# Patient Record
Sex: Female | Born: 1960 | Race: White | Hispanic: No | Smoking: Never smoker
Health system: Southern US, Community
[De-identification: ages and names within clinical notes are randomized; demographics above are authoritative.]

## PROBLEM LIST (undated history)

## (undated) DIAGNOSIS — F419 Anxiety disorder, unspecified: Secondary | ICD-10-CM

## (undated) DIAGNOSIS — N189 Chronic kidney disease, unspecified: Secondary | ICD-10-CM

## (undated) DIAGNOSIS — R0602 Shortness of breath: Secondary | ICD-10-CM

## (undated) DIAGNOSIS — F329 Major depressive disorder, single episode, unspecified: Secondary | ICD-10-CM

## (undated) DIAGNOSIS — G4730 Sleep apnea, unspecified: Secondary | ICD-10-CM

## (undated) DIAGNOSIS — J45909 Unspecified asthma, uncomplicated: Secondary | ICD-10-CM

## (undated) DIAGNOSIS — E538 Deficiency of other specified B group vitamins: Secondary | ICD-10-CM

## (undated) DIAGNOSIS — R002 Palpitations: Secondary | ICD-10-CM

## (undated) DIAGNOSIS — G43909 Migraine, unspecified, not intractable, without status migrainosus: Secondary | ICD-10-CM

## (undated) DIAGNOSIS — K219 Gastro-esophageal reflux disease without esophagitis: Secondary | ICD-10-CM

## (undated) DIAGNOSIS — M797 Fibromyalgia: Secondary | ICD-10-CM

## (undated) DIAGNOSIS — Z8489 Family history of other specified conditions: Secondary | ICD-10-CM

## (undated) DIAGNOSIS — E785 Hyperlipidemia, unspecified: Secondary | ICD-10-CM

## (undated) DIAGNOSIS — D126 Benign neoplasm of colon, unspecified: Secondary | ICD-10-CM

## (undated) DIAGNOSIS — F32A Depression, unspecified: Secondary | ICD-10-CM

## (undated) DIAGNOSIS — K589 Irritable bowel syndrome without diarrhea: Secondary | ICD-10-CM

## (undated) DIAGNOSIS — G4700 Insomnia, unspecified: Secondary | ICD-10-CM

## (undated) HISTORY — DX: Migraine, unspecified, not intractable, without status migrainosus: G43.909

## (undated) HISTORY — PX: KNEE SURGERY: SHX244

## (undated) HISTORY — DX: Depression, unspecified: F32.A

## (undated) HISTORY — PX: ELBOW SURGERY: SHX618

## (undated) HISTORY — DX: Fibromyalgia: M79.7

## (undated) HISTORY — PX: VAGINAL HYSTERECTOMY: SUR661

## (undated) HISTORY — DX: Benign neoplasm of colon, unspecified: D12.6

## (undated) HISTORY — DX: Chronic kidney disease, unspecified: N18.9

## (undated) HISTORY — DX: Deficiency of other specified B group vitamins: E53.8

## (undated) HISTORY — DX: Insomnia, unspecified: G47.00

## (undated) HISTORY — PX: HERNIA REPAIR: SHX51

## (undated) HISTORY — PX: ECTOPIC PREGNANCY SURGERY: SHX613

## (undated) HISTORY — DX: Anxiety disorder, unspecified: F41.9

## (undated) HISTORY — DX: Hyperlipidemia, unspecified: E78.5

## (undated) HISTORY — PX: POLYPECTOMY: SHX149

## (undated) HISTORY — DX: Major depressive disorder, single episode, unspecified: F32.9

---

## 1987-08-29 HISTORY — PX: CHOLECYSTECTOMY: SHX55

## 2011-06-02 ENCOUNTER — Other Ambulatory Visit: Payer: Self-pay | Admitting: Family Medicine

## 2011-10-03 ENCOUNTER — Other Ambulatory Visit: Payer: Self-pay | Admitting: Family Medicine

## 2011-10-03 DIAGNOSIS — Z1231 Encounter for screening mammogram for malignant neoplasm of breast: Secondary | ICD-10-CM

## 2011-10-17 ENCOUNTER — Ambulatory Visit: Payer: Self-pay

## 2011-10-24 ENCOUNTER — Ambulatory Visit: Payer: Self-pay

## 2011-11-15 ENCOUNTER — Ambulatory Visit: Payer: Self-pay

## 2011-11-28 ENCOUNTER — Ambulatory Visit
Admission: RE | Admit: 2011-11-28 | Discharge: 2011-11-28 | Disposition: A | Discharge status: Home / Self Care | Payer: Medicare Other | Source: Ambulatory Visit | Attending: Family Medicine | Admitting: Family Medicine

## 2011-11-28 DIAGNOSIS — Z1231 Encounter for screening mammogram for malignant neoplasm of breast: Secondary | ICD-10-CM

## 2013-01-17 ENCOUNTER — Ambulatory Visit: Payer: Medicare Other | Admitting: Diagnostic Neuroimaging

## 2013-01-29 ENCOUNTER — Ambulatory Visit (INDEPENDENT_AMBULATORY_CARE_PROVIDER_SITE_OTHER): Payer: Medicare Other | Admitting: Diagnostic Neuroimaging

## 2013-01-29 ENCOUNTER — Encounter: Payer: Self-pay | Admitting: Diagnostic Neuroimaging

## 2013-01-29 VITALS — BP 136/82 | HR 68 | Temp 98.9°F | Ht 65.5 in | Wt 214.0 lb

## 2013-01-29 DIAGNOSIS — R5383 Other fatigue: Secondary | ICD-10-CM

## 2013-01-29 DIAGNOSIS — R200 Anesthesia of skin: Secondary | ICD-10-CM | POA: Insufficient documentation

## 2013-01-29 DIAGNOSIS — R259 Unspecified abnormal involuntary movements: Secondary | ICD-10-CM

## 2013-01-29 DIAGNOSIS — R531 Weakness: Secondary | ICD-10-CM | POA: Insufficient documentation

## 2013-01-29 DIAGNOSIS — R251 Tremor, unspecified: Secondary | ICD-10-CM

## 2013-01-29 DIAGNOSIS — R5381 Other malaise: Secondary | ICD-10-CM

## 2013-01-29 DIAGNOSIS — R209 Unspecified disturbances of skin sensation: Secondary | ICD-10-CM

## 2013-01-29 NOTE — Progress Notes (Signed)
[facility]  PATIENT: [name] DOB: 1961/07/30  REFERRING CLINICIAN: Stallings HISTORY FROM: patient  REASON FOR VISIT: new consult   HISTORICAL  CHIEF COMPLAINT:  Chief Complaint  Patient presents with  . Neurologic Problem    HISTORY OF PRESENT ILLNESS:   52 year old right-handed female here for evaluation of headaches, vision changes, muscle cramps, pain.  Patient reports long history of migraine headaches, 20 days of headache per month. These are bilateral severe headaches with nausea, vomiting, photophobia and phonophobia. Headaches can last up to one to 2 days at a time. She thinks she may have tried amitriptyline and Topamax the past.  Also patient is concerned about possibility of multiple sclerosis. Patient's sister diagnosed with multiple scars 2004 and the patient feels she is having similar symptoms. She describes intermittent cramps of her right hand lasting for a few seconds a time. She has intermittent left facial pain. She has had intermittent postural tremor for the past 3-4 months. Tremor can also occur at rest. She has significant gait deterioration over the past one to 2 years. She is having some urinary and bowel control problems. Also reports numerous things and review of systems.  REVIEW OF SYSTEMS: Full 14 system review of systems performed and notable only for fever chills fatigue palpitation ringing in ears trouble swallowing rash birthmarks itching diarrhea constipation incontinence shortness of breath cough wheezing snoring or vision loss of vision easy bruising easy bleeding feeling hot feeling cold increased thirst flushing joint pain joint swelling cramps aching muscles allergies runny nose racing thoughts prior suicidal thoughts disinterest in activities decreased at to be decreased energy not sleep depression anxiety dizziness difficulty swallowing restless leg snoring sleepiness insomnia numbness weakness headache confusion  memory loss.  ALLERGIES: Allergies  Allergen Reactions  . Aspirin Swelling  . Benzocaine   . Lyrica (Pregabalin)     HOME MEDICATIONS: No outpatient prescriptions prior to visit.   No facility-administered medications prior to visit.    PAST MEDICAL HISTORY: Past Medical History  Diagnosis Date  . Migraine   . Anxiety and depression   . Fibromyalgia     PAST SURGICAL HISTORY: Past Surgical History  Procedure Laterality Date  . Ectopic pregnancy surgery    . Elbow surgery    . Vaginal hysterectomy    . Hernia repair    . Cholecystectomy    . Knee surgery      FAMILY HISTORY: Family History  Problem Relation Age of Onset  . Multiple sclerosis Sister   . Depression    . Diabetes    . Heart attack      SOCIAL HISTORY:  History   Social History  . Marital Status: Divorced    Spouse Name: N/A    Number of Children: 1  . Years of Education: 12th   Occupational History  . N/A    Social History Main Topics  . Smoking status: Never Smoker   . Smokeless tobacco: Never Used  . Alcohol Use: Not on file     Comment: occasionally  . Drug Use: No  . Sexually Active: Not on file   Other Topics Concern  . Not on file   Social History Narrative   Pt lives at home alone.   Caffeine Use: none; quit years ago     PHYSICAL EXAM  Filed Vitals:   01/29/13 1229  BP: 136/82  Pulse: 68  Temp: 98.9 F (37.2 C)  TempSrc: Oral  Height: 5' 5.5" (1.664 m)  Weight: 214  lb (97.07 kg)    Not recorded    Body mass index is 35.06 kg/(m^2).  GENERAL EXAM: Patient is in no distress  CARDIOVASCULAR: Regular rate and rhythm, no murmurs, no carotid bruits  NEUROLOGIC: MENTAL STATUS: awake, alert, language fluent, comprehension intact, naming intact CRANIAL NERVE: no papilledema on fundoscopic exam, RELATIVE RIGHT APD. DECR SENS IN RIGHT EYE TO LIGHT. visual fields full to confrontation, extraocular muscles intact, no nystagmus, facial sensation and strength  symmetric, uvula midline, shoulder shrug symmetric, tongue midline. MOTOR: POSTURAL AND RESTING TREMOR. INCREASE TONE IN BUE AND BLE (RIGHT > LEFT). CLONUS IN RIGHT ANKLE. DECR STRENGTH IN BLE (4/5).  SENSORY: DECR SENS IN TOES.  COORDINATION: ATAXIA IN BUE. REFLEXES: RUE 4, LUE 3, RIGHT KNEE 1, LEFT KNEE 2, RIGHT ANKLE 4, LEFT ANKLE 2. MUTE TOES. GAIT/STATION: UNSTEADY, ATAXIC GAIT.   DIAGNOSTIC DATA (LABS, IMAGING, TESTING) - I reviewed patient records, labs, notes, testing and imaging myself where available.  No results found for this basename: WBC, HGB, HCT, MCV, PLT   No results found for this basename: na, k, cl, co2, glucose, bun, creatinine, calcium, prot, albumin, ast, alt, alkphos, bilitot, gfrnonaa, gfraa   No results found for this basename: CHOL, HDL, LDLCALC, LDLDIRECT, TRIG, CHOLHDL   No results found for this basename: HGBA1C   No results found for this basename: VITAMINB12   No results found for this basename: TSH      ASSESSMENT AND PLAN  52 y.o. year old female  has a past medical history of Migraine; Anxiety and depression; and Fibromyalgia. here with migraine headaches. We can treat these with sumatriptan. More concerning are the constellation of other symptoms and physical exam findings raising possibility of demyelinating disease. I will check MRI of the brain and cervical spine for further evaluation.   Orders Placed This Encounter  Procedures  . MR Brain W Wo Contrast  . MR Cervical Spine W Wo Contrast     Suanne Marker, MD 01/29/2013, 1:16 PM Certified in Neurology, Neurophysiology and Neuroimaging  [facility] [address], [address] [place], [place] [address] [phone]

## 2013-01-29 NOTE — Patient Instructions (Signed)
I will order MRIs.

## 2013-01-30 ENCOUNTER — Encounter: Payer: Self-pay | Admitting: Diagnostic Neuroimaging

## 2013-02-09 ENCOUNTER — Ambulatory Visit
Admission: RE | Admit: 2013-02-09 | Discharge: 2013-02-09 | Disposition: A | Discharge status: Home / Self Care | Payer: Medicare Other | Source: Ambulatory Visit | Attending: Diagnostic Neuroimaging | Admitting: Diagnostic Neuroimaging

## 2013-02-09 DIAGNOSIS — R251 Tremor, unspecified: Secondary | ICD-10-CM

## 2013-02-09 DIAGNOSIS — R209 Unspecified disturbances of skin sensation: Secondary | ICD-10-CM

## 2013-02-09 DIAGNOSIS — R5381 Other malaise: Secondary | ICD-10-CM

## 2013-02-09 DIAGNOSIS — R531 Weakness: Secondary | ICD-10-CM

## 2013-02-09 DIAGNOSIS — R5383 Other fatigue: Secondary | ICD-10-CM

## 2013-02-09 DIAGNOSIS — R200 Anesthesia of skin: Secondary | ICD-10-CM

## 2013-02-09 DIAGNOSIS — R259 Unspecified abnormal involuntary movements: Secondary | ICD-10-CM

## 2013-02-13 ENCOUNTER — Telehealth: Payer: Self-pay | Admitting: *Deleted

## 2013-02-13 NOTE — Telephone Encounter (Signed)
Message copied by Avie Echevaria on Thu Feb 13, 2013 12:47 PM ------      Message from: Joycelyn Schmid      Created: Thu Feb 13, 2013 10:07 AM       Call pt with normal MRI brain and cervical spine results. -VRP            ----- Message -----         From: Suanne Marker, MD         Sent: 02/11/2013   6:06 PM           To: Suanne Marker, MD                   ------

## 2013-02-13 NOTE — Telephone Encounter (Signed)
Spoke with patient and relayed the results of their MRI's.  The patient was also reminded of any future appointments.  The pt expressed concern because her symptoms have worsened and she is not scheduled for a f/u until October 06, 2013.  She would like a plan of care / advice she should follow until her next encounter.  Please advise.

## 2013-02-13 NOTE — Telephone Encounter (Signed)
Voice message left requesting a call-back. 

## 2013-02-14 ENCOUNTER — Encounter (HOSPITAL_BASED_OUTPATIENT_CLINIC_OR_DEPARTMENT_OTHER): Payer: Self-pay | Admitting: *Deleted

## 2013-02-14 NOTE — Progress Notes (Signed)
Pt instructed npo p mn 6/25 x prevacid, vistaril w sip of water.  To [facility] 6/26 @ 1030.  Needs hgb, ?ekg. Pt asked to use her inhaler the am of surgery and bring it w/ her as well as her cpap mask. Pt  Verbalized her understanding

## 2013-02-19 NOTE — H&P (Signed)
[name] DOB: 12/28/1960  Chief Complaint: right knee pain  History of Present Illness The patient is a 52 year old female who presents with knee complaints. The patient reports right knee symptoms including: pain which began 2 months ago following a specific injury. The injury occurred due to a fall while the patient was at home. The patient describes the severity of the symptoms as moderate in severity.The patient feels that the symptoms are worsening. Symptoms are reported to be located in the right knee and include knee pain, swelling, decreased range of motion, instability, difficulty bearing weight and difficulty ambulating. The patient describes their pain as sharp and aching. Symptoms are exacerbated by motion at the knee, weight bearing and walking. She has had previous arthroscopy on the right knee in the past. She had a cortisone injection in the right knee, which provided limited benefit. MRI revealed a tear of the medial meniscus, tear of the lateral meniscus, and she has a complete to near complete chondral loss of the posterior weightbearing surface of the lateral femoral condyle. She has some lateral tibial plateau changes as well.   Allergies Aspirin *ANALGESICS - NonNarcotic* Benzocaine *DERMATOLOGICALS* Lyrica *ANTICONVULSANTS*   Family History Bleeding disorder. mother Cancer. grandmother fathers side Cerebrovascular Accident. mother Congestive Heart Failure. mother Depression. father Diabetes Mellitus. mother Drug / Alcohol Addiction. sister and grandfather mothers side Heart Disease. mother Heart disease in female family member before age 50 Heart disease in female family member before age 32 Hypertension. mother and brother Kidney disease. mother   Social History Alcohol use. current drinker; drinks wine and hard liquor; only occasionally per week Children. 1 Current work status. disabled Living situation. live alone Marital  status. divorced Tobacco use. never smoker   Medication History HydrOXYzine Pamoate (25MG  Capsule, Oral) Active. Venlafaxine HCl ER (75MG  Capsule ER 24HR, Oral) Active. TraZODone HCl (50MG  Tablet, Oral) Active. Lansoprazole (30MG  Capsule DR, Oral) Active. SUMAtriptan Succinate (25MG  Tablet, Oral) Active. Atrovent HFA (17MCG/ACT Aerosol Soln, Inhalation) Active. Vitamin D (50000U Tablet, Oral) Active.    Past Surgical History Gallbladder Surgery. open Hemorrhoidectomy Hysterectomy. complete (non-cancerous)   Past Medical History Anxiety Disorder Asthma Chronic Pain Depression Fibromyalgia Gastroesophageal Reflux Disease Migraine Headache Sleep Apnea   Review of Systems General:Present- Chills, Fever, Night Sweats and Fatigue. Not Present- Appetite Loss, Feeling sick, Weight Gain and Weight Loss. Skin:Present- Itching and Change in Hair or Nails. Not Present- Rash, Skin Color Changes, Ulcer and Psoriasis. HEENT:Present- Sensitivity to light and Visual Loss. Not Present- Hearing problems, Nose Bleed, Decreased Hearing and Ringing in the Ears. Neck:Present- Swollen Glands. Not Present- Neck Mass. Respiratory:Present- Shortness of breath, Snoring, Chronic Cough and Dyspnea. Not Present- Bloody sputum. Cardiovascular:Present- Shortness of Breath, Leg Cramps and Palpitations. Not Present- Chest Pain and Swelling of Extremities. Gastrointestinal:Present- Heartburn, Abdominal Pain, Vomiting, Nausea and Incontinence of Stool. Not Present- Bloody Stool. Female Genitourinary:Not Present- Blood in Urine, Irregular/missing periods, Menstrual Irregularities, Frequency, Incontinence and Nocturia. Musculoskeletal:Present- Muscle Weakness, Muscle Pain, Joint Stiffness, Joint Swelling, Joint Pain and Back Pain. Neurological:Present- Tingling, Numbness, Burning, Tremor, Headaches and Dizziness. Psychiatric:Present- Anxiety, Depression and Memory Loss. Endocrine:Present-  Cold Intolerance, Heat Intolerance and Excessive Thirst. Not Present- Excessive hunger. Hematology:Present- Abnormal bruising and Easy Bruising. Not Present- Abnormal Bleeding, Anemia and Blood Clots.   Vitals Weight: 216 lb Height: 65.5 in Body Surface Area: 2.13 m Body Mass Index: 35.4 kg/m BP: 112/72 (Sitting, Left Arm, Standard)    Objective  Physical examination of her right knee reveals no instability. Positive McMurrays. Mild  effusion. No erythema. No signs of gout. No signs of infection.  Flexion and extension is very painful. She is stable as far as her collateral ligaments and cruciate ligaments. Her calf is fine, no deep venous thrombosis. Heart sounds normal. RRR. Lungs clear to auscultation. Bowel sounds active. EOM intact. Neck supple.   Assessment & Plan Right knee, medial and lateral meniscus tears She definitely needs an arthroscopic medial and lateral meniscectomy and clean out of the right knee under general. Risks of surgery are rare. Infection is rare, put her on antibiotics. She cannot take aspirin, so we will just probably put her on Lovenox shots post op. She should not eat or drink after midnight the night of surgery.     Dimitri Ped, PA-C

## 2013-02-20 ENCOUNTER — Other Ambulatory Visit: Payer: Self-pay

## 2013-02-20 ENCOUNTER — Encounter (HOSPITAL_BASED_OUTPATIENT_CLINIC_OR_DEPARTMENT_OTHER): Payer: Self-pay | Admitting: Anesthesiology

## 2013-02-20 ENCOUNTER — Encounter (HOSPITAL_BASED_OUTPATIENT_CLINIC_OR_DEPARTMENT_OTHER): Payer: Self-pay | Admitting: *Deleted

## 2013-02-20 ENCOUNTER — Encounter (HOSPITAL_BASED_OUTPATIENT_CLINIC_OR_DEPARTMENT_OTHER)
Admission: RE | Disposition: A | Discharge status: Home / Self Care | Payer: Self-pay | Source: Ambulatory Visit | Attending: Orthopedic Surgery

## 2013-02-20 ENCOUNTER — Ambulatory Visit (HOSPITAL_BASED_OUTPATIENT_CLINIC_OR_DEPARTMENT_OTHER)
Admission: RE | Admit: 2013-02-20 | Discharge: 2013-02-20 | Disposition: A | Discharge status: Home / Self Care | Payer: Medicare Other | Source: Ambulatory Visit | Attending: Orthopedic Surgery | Admitting: Orthopedic Surgery

## 2013-02-20 ENCOUNTER — Ambulatory Visit (HOSPITAL_BASED_OUTPATIENT_CLINIC_OR_DEPARTMENT_OTHER): Payer: Medicare Other | Admitting: Anesthesiology

## 2013-02-20 DIAGNOSIS — M659 Unspecified synovitis and tenosynovitis, unspecified site: Secondary | ICD-10-CM | POA: Insufficient documentation

## 2013-02-20 DIAGNOSIS — M23269 Derangement of other lateral meniscus due to old tear or injury, unspecified knee: Secondary | ICD-10-CM | POA: Diagnosis present

## 2013-02-20 DIAGNOSIS — S83289A Other tear of lateral meniscus, current injury, unspecified knee, initial encounter: Secondary | ICD-10-CM | POA: Insufficient documentation

## 2013-02-20 DIAGNOSIS — Y92009 Unspecified place in unspecified non-institutional (private) residence as the place of occurrence of the external cause: Secondary | ICD-10-CM | POA: Insufficient documentation

## 2013-02-20 DIAGNOSIS — IMO0001 Reserved for inherently not codable concepts without codable children: Secondary | ICD-10-CM | POA: Insufficient documentation

## 2013-02-20 DIAGNOSIS — M1710 Unilateral primary osteoarthritis, unspecified knee: Secondary | ICD-10-CM | POA: Insufficient documentation

## 2013-02-20 DIAGNOSIS — K219 Gastro-esophageal reflux disease without esophagitis: Secondary | ICD-10-CM | POA: Insufficient documentation

## 2013-02-20 DIAGNOSIS — M1711 Unilateral primary osteoarthritis, right knee: Secondary | ICD-10-CM | POA: Diagnosis present

## 2013-02-20 DIAGNOSIS — M23329 Other meniscus derangements, posterior horn of medial meniscus, unspecified knee: Secondary | ICD-10-CM | POA: Diagnosis present

## 2013-02-20 DIAGNOSIS — W19XXXA Unspecified fall, initial encounter: Secondary | ICD-10-CM | POA: Insufficient documentation

## 2013-02-20 DIAGNOSIS — G4730 Sleep apnea, unspecified: Secondary | ICD-10-CM | POA: Insufficient documentation

## 2013-02-20 HISTORY — DX: Unspecified asthma, uncomplicated: J45.909

## 2013-02-20 HISTORY — DX: Sleep apnea, unspecified: G47.30

## 2013-02-20 HISTORY — DX: Anxiety disorder, unspecified: F41.9

## 2013-02-20 HISTORY — DX: Gastro-esophageal reflux disease without esophagitis: K21.9

## 2013-02-20 HISTORY — DX: Shortness of breath: R06.02

## 2013-02-20 HISTORY — PX: KNEE ARTHROSCOPY WITH LATERAL MENISECTOMY: SHX6193

## 2013-02-20 HISTORY — DX: Family history of other specified conditions: Z84.89

## 2013-02-20 HISTORY — DX: Irritable bowel syndrome, unspecified: K58.9

## 2013-02-20 HISTORY — DX: Palpitations: R00.2

## 2013-02-20 SURGERY — ARTHROSCOPY, KNEE, WITH LATERAL MENISCECTOMY
Anesthesia: General | Site: Knee | Laterality: Right | Wound class: Clean

## 2013-02-20 MED ORDER — OXYCODONE-ACETAMINOPHEN 5-325 MG PO TABS
1.0000 | ORAL_TABLET | ORAL | Status: AC | PRN
  Administered 2013-02-20: 1 via ORAL
  Filled 2013-02-20: qty 1

## 2013-02-20 MED ORDER — MIDAZOLAM HCL 2 MG/2ML IJ SOLN
2.0000 mg | Freq: Once | INTRAMUSCULAR | Status: AC
  Administered 2013-02-20: 1 mg via INTRAVENOUS
  Filled 2013-02-20: qty 2

## 2013-02-20 MED ORDER — OXYCODONE HCL 5 MG PO TABS
5.0000 mg | ORAL_TABLET | ORAL | Status: AC | PRN
  Administered 2013-02-20: 5 mg via ORAL
  Filled 2013-02-20: qty 1

## 2013-02-20 MED ORDER — PROPOFOL 10 MG/ML IV BOLUS
INTRAVENOUS | Status: DC | PRN
  Administered 2013-02-20: 20 mg via INTRAVENOUS
  Administered 2013-02-20: 200 mg via INTRAVENOUS
  Administered 2013-02-20: 30 mg via INTRAVENOUS

## 2013-02-20 MED ORDER — CHLORHEXIDINE GLUCONATE 4 % EX LIQD
60.0000 mL | Freq: Once | CUTANEOUS | Status: DC
  Filled 2013-02-20: qty 60

## 2013-02-20 MED ORDER — PROMETHAZINE HCL 25 MG/ML IJ SOLN
6.2500 mg | INTRAMUSCULAR | Status: DC | PRN
  Filled 2013-02-20: qty 1

## 2013-02-20 MED ORDER — CEFAZOLIN SODIUM-DEXTROSE 2-3 GM-% IV SOLR
2.0000 g | INTRAVENOUS | Status: AC
  Administered 2013-02-20: 2 g via INTRAVENOUS
  Filled 2013-02-20: qty 50

## 2013-02-20 MED ORDER — LIDOCAINE HCL (CARDIAC) 20 MG/ML IV SOLN
INTRAVENOUS | Status: DC | PRN
  Administered 2013-02-20: 75 mg via INTRAVENOUS

## 2013-02-20 MED ORDER — METHOCARBAMOL 500 MG PO TABS
500.0000 mg | ORAL_TABLET | Freq: Three times a day (TID) | ORAL | Status: AC
  Administered 2013-02-20: 500 mg via ORAL
  Filled 2013-02-20: qty 1

## 2013-02-20 MED ORDER — DEXAMETHASONE SODIUM PHOSPHATE 4 MG/ML IJ SOLN
INTRAMUSCULAR | Status: DC | PRN
  Administered 2013-02-20: 10 mg via INTRAVENOUS

## 2013-02-20 MED ORDER — LACTATED RINGERS IV SOLN
INTRAVENOUS | Status: DC
  Filled 2013-02-20: qty 1000

## 2013-02-20 MED ORDER — FENTANYL CITRATE 0.05 MG/ML IJ SOLN
25.0000 ug | INTRAMUSCULAR | Status: DC | PRN
  Administered 2013-02-20: 0.5 ug via INTRAVENOUS
  Administered 2013-02-20: 0.25 ug via INTRAVENOUS
  Filled 2013-02-20: qty 1

## 2013-02-20 MED ORDER — ONDANSETRON HCL 4 MG/2ML IJ SOLN
INTRAMUSCULAR | Status: DC | PRN
  Administered 2013-02-20: 4 mg via INTRAVENOUS

## 2013-02-20 MED ORDER — FENTANYL CITRATE 0.05 MG/ML IJ SOLN
INTRAMUSCULAR | Status: DC | PRN
  Administered 2013-02-20 (×2): 25 ug via INTRAVENOUS
  Administered 2013-02-20 (×2): 50 ug via INTRAVENOUS
  Administered 2013-02-20: 25 ug via INTRAVENOUS

## 2013-02-20 MED ORDER — MIDAZOLAM HCL 5 MG/5ML IJ SOLN
INTRAMUSCULAR | Status: DC | PRN
  Administered 2013-02-20: 1 mg via INTRAVENOUS

## 2013-02-20 MED ORDER — LACTATED RINGERS IV SOLN
INTRAVENOUS | Status: DC
  Administered 2013-02-20: 13:00:00 via INTRAVENOUS
  Filled 2013-02-20: qty 1000

## 2013-02-20 MED ORDER — BUPIVACAINE-EPINEPHRINE 0.25% -1:200000 IJ SOLN
INTRAMUSCULAR | Status: DC | PRN
  Administered 2013-02-20: 30 mL

## 2013-02-20 MED ORDER — SODIUM CHLORIDE 0.9 % IR SOLN
Status: DC | PRN
  Administered 2013-02-20: 6000 mL

## 2013-02-20 MED ORDER — OXYCODONE-ACETAMINOPHEN 10-325 MG PO TABS: 1.0000 | ORAL_TABLET | ORAL | Status: DC | PRN

## 2013-02-20 MED ORDER — BACITRACIN-NEOMYCIN-POLYMYXIN 400-5-5000 EX OINT
TOPICAL_OINTMENT | CUTANEOUS | Status: DC | PRN
  Administered 2013-02-20: 1 via TOPICAL

## 2013-02-20 SURGICAL SUPPLY — 40 items
BANDAGE ELASTIC 4 VELCRO ST LF (GAUZE/BANDAGES/DRESSINGS) ×2 IMPLANT
BANDAGE ELASTIC 6 VELCRO ST LF (GAUZE/BANDAGES/DRESSINGS) ×2 IMPLANT
BANDAGE GAUZE ELAST BULKY 4 IN (GAUZE/BANDAGES/DRESSINGS) ×2 IMPLANT
BLADE GREAT WHITE 4.2 (BLADE) ×2 IMPLANT
BNDG COHESIVE 6X5 TAN NS LF (GAUZE/BANDAGES/DRESSINGS) ×2 IMPLANT
CANISTER SUCT LVC 12 LTR MEDI- (MISCELLANEOUS) ×2 IMPLANT
CANISTER SUCTION 2500CC (MISCELLANEOUS) ×2 IMPLANT
CLOTH BEACON ORANGE TIMEOUT ST (SAFETY) ×2 IMPLANT
DRAPE ARTHROSCOPY W/POUCH 114 (DRAPES) ×2 IMPLANT
DRAPE LG THREE QUARTER DISP (DRAPES) ×2 IMPLANT
DRSG EMULSION OIL 3X3 NADH (GAUZE/BANDAGES/DRESSINGS) ×2 IMPLANT
DRSG PAD ABDOMINAL 8X10 ST (GAUZE/BANDAGES/DRESSINGS) ×6 IMPLANT
DURAPREP 26ML APPLICATOR (WOUND CARE) ×2 IMPLANT
ELECT MENISCUS 165MM 90D (ELECTRODE) IMPLANT
ELECT REM PT RETURN 9FT ADLT (ELECTROSURGICAL)
ELECTRODE REM PT RTRN 9FT ADLT (ELECTROSURGICAL) IMPLANT
GLOVE BIO SURGEON STRL SZ7 (GLOVE) ×2 IMPLANT
GLOVE ECLIPSE 8.0 STRL XLNG CF (GLOVE) ×2 IMPLANT
GLOVE INDICATOR 7.0 STRL GRN (GLOVE) ×4 IMPLANT
GLOVE INDICATOR 8.5 STRL (GLOVE) ×4 IMPLANT
GLOVE SKINSENSE NS SZ6.5 (GLOVE) ×1
GLOVE SKINSENSE STRL SZ6.5 (GLOVE) ×1 IMPLANT
GLOVE SURG ORTHO 6.0 STRL STRW (GLOVE) ×2 IMPLANT
GOWN PREVENTION PLUS LG XLONG (DISPOSABLE) ×2 IMPLANT
GOWN STRL REIN XL XLG (GOWN DISPOSABLE) ×2 IMPLANT
IV NS IRRIG 3000ML ARTHROMATIC (IV SOLUTION) ×4 IMPLANT
KNEE WRAP E Z 3 GEL PACK (MISCELLANEOUS) ×2 IMPLANT
PACK ARTHROSCOPY DSU (CUSTOM PROCEDURE TRAY) ×2 IMPLANT
PACK BASIN DAY SURGERY FS (CUSTOM PROCEDURE TRAY) ×2 IMPLANT
PADDING CAST COTTON 6X4 STRL (CAST SUPPLIES) ×2 IMPLANT
PENCIL BUTTON HOLSTER BLD 10FT (ELECTRODE) IMPLANT
SET ARTHROSCOPY TUBING (MISCELLANEOUS) ×1
SET ARTHROSCOPY TUBING LN (MISCELLANEOUS) ×1 IMPLANT
SPONGE GAUZE 4X4 12PLY (GAUZE/BANDAGES/DRESSINGS) ×2 IMPLANT
SPONGE SURGIFOAM ABS GEL 12-7 (HEMOSTASIS) IMPLANT
SUT ETHILON 3 0 PS 1 (SUTURE) ×2 IMPLANT
SYR CONTROL 10ML LL (SYRINGE) IMPLANT
TOWEL OR 17X24 6PK STRL BLUE (TOWEL DISPOSABLE) ×2 IMPLANT
WAND 90 DEG TURBOVAC W/CORD (SURGICAL WAND) ×2 IMPLANT
WATER STERILE IRR 500ML POUR (IV SOLUTION) ×2 IMPLANT

## 2013-02-20 NOTE — Brief Op Note (Signed)
02/20/2013  1:59 PM  PATIENT:  [name]  52 y.o. female  PRE-OPERATIVE DIAGNOSIS:  right knee torn medial and lateral meniscal  POST-OPERATIVE DIAGNOSIS:  right knee torn medial and lateral meniscal and severe Osteoarthritis and severe Synovitis Right Knee.  PROCEDURE:  Procedure(s): RIGHT KNEE ARTHROSCOPY WITH MEDIAL AND LATERAL MENISECTOMY, Abrasion Chondroplasty of medial and lateral femoral condyle, microfracture of medial and lateral condyle, synovectomy of supra patella pouch (Right)  SURGEON:  Surgeon(s) and Role:    * Jacki Cones, MD - Primary     ASSISTANTS: OR Tech   ANESTHESIA:   general  EBL:     BLOOD ADMINISTERED:none  DRAINS: none   LOCAL MEDICATIONS USED:  MARCAINE 30cc 0.25% with Epinephrine.    SPECIMEN:  No Specimen  DISPOSITION OF SPECIMEN:  N/A  COUNTS:  YES  TOURNIQUET:  * No tourniquets in log *  DICTATION: .Other Dictation: Dictation Number [id number]  PLAN OF CARE: Discharge to home after PACU  PATIENT DISPOSITION:  PACU - hemodynamically stable.   Delay start of Pharmacological VTE agent (>24hrs) due to surgical blood loss or risk of bleeding: yes

## 2013-02-20 NOTE — H&P (View-Only) (Signed)
Versed 1 mg given by dennis keeton crna in preop. At 12:09 

## 2013-02-20 NOTE — Progress Notes (Signed)
Versed 1 mg given by Jearld Shines crna in preop. At 12:09

## 2013-02-20 NOTE — Anesthesia Procedure Notes (Signed)
Procedure Name: LMA Insertion Date/Time: 02/20/2013 1:14 PM Performed by: Gar Gibbon Pre-anesthesia Checklist: Patient identified, Emergency Drugs available, Suction available and Patient being monitored Patient Re-evaluated:Patient Re-evaluated prior to inductionOxygen Delivery Method: Circle System Utilized Preoxygenation: Pre-oxygenation with 100% oxygen Intubation Type: IV induction Ventilation: Mask ventilation without difficulty LMA: LMA inserted LMA Size: 4.0 Number of attempts: 1 Airway Equipment and Method: bite block Placement Confirmation: positive ETCO2 Tube secured with: Tape Dental Injury: Teeth and Oropharynx as per pre-operative assessment

## 2013-02-20 NOTE — Anesthesia Preprocedure Evaluation (Addendum)
Anesthesia Evaluation  Patient identified by MRN, date of birth, ID band Patient awake    Reviewed: Allergy & Precautions, H&P , NPO status , Patient's Chart, lab work & pertinent test results  Airway Mallampati: II TM Distance: >3 FB Neck ROM: Full    Dental  (+) Partial Lower, Partial Upper and Dental Advisory Given   Pulmonary shortness of breath, with exertion and lying, asthma , sleep apnea and Continuous Positive Airway Pressure Ventilation ,  breath sounds clear to auscultation  Pulmonary exam normal       Cardiovascular negative cardio ROS  Rhythm:Regular Rate:Normal     Neuro/Psych  Headaches, Anxiety Severe anxiety and panic attacks.negative neurological ROS  negative psych ROS   GI/Hepatic Neg liver ROS, GERD-  Medicated,  Endo/Other  negative endocrine ROS  Renal/GU negative Renal ROS  negative genitourinary   Musculoskeletal negative musculoskeletal ROS (+) Fibromyalgia -  Abdominal   Peds negative pediatric ROS (+)  Hematology negative hematology ROS (+)   Anesthesia Other Findings   Reproductive/Obstetrics                          Anesthesia Physical Anesthesia Plan  ASA: II  Anesthesia Plan: General   Post-op Pain Management:    Induction: Intravenous  Airway Management Planned: LMA  Additional Equipment:   Intra-op Plan:   Post-operative Plan: Extubation in OR  Informed Consent: I have reviewed the patients History and Physical, chart, labs and discussed the procedure including the risks, benefits and alternatives for the proposed anesthesia with the patient or authorized representative who has indicated his/her understanding and acceptance.   Dental advisory given  Plan Discussed with: CRNA  Anesthesia Plan Comments:         Anesthesia Quick Evaluation

## 2013-02-20 NOTE — Interval H&P Note (Signed)
History and Physical Interval Note:  02/20/2013 1:03 PM  [name]  has presented today for surgery, with the diagnosis of right knee torn medial and lateral meniscal  The various methods of treatment have been discussed with the patient and family. After consideration of risks, benefits and other options for treatment, the patient has consented to  Procedure(s): RIGHT KNEE ARTHROSCOPY WITH MEDIAL AND LATERAL MENISECTOMY (Right) as a surgical intervention .  The patient's history has been reviewed, patient examined, no change in status, stable for surgery.  I have reviewed the patient's chart and labs.  Questions were answered to the patient's satisfaction.     [name] A

## 2013-02-20 NOTE — Transfer of Care (Signed)
Immediate Anesthesia Transfer of Care Note  Patient: [name]  Procedure(s) Performed: Procedure(s): RIGHT KNEE ARTHROSCOPY WITH MEDIAL AND LATERAL MENISECTOMY, Abrasion Chondroplasty of medial and lateral femoral condyle, microfracture of medial and lateral condyle, synovectomy of supra patella pouch (Right)  Patient Location: PACU  Anesthesia Type:General  Level of Consciousness: sedated and patient cooperative  Airway & Oxygen Therapy: Patient Spontanous Breathing and Patient connected to face mask oxygen  Post-op Assessment: Report given to PACU RN and Post -op Vital signs reviewed and stable  Post vital signs: Reviewed and stable  Complications: No apparent anesthesia complications

## 2013-02-21 ENCOUNTER — Encounter (HOSPITAL_BASED_OUTPATIENT_CLINIC_OR_DEPARTMENT_OTHER): Payer: Self-pay | Admitting: Orthopedic Surgery

## 2013-02-21 LAB — POCT HEMOGLOBIN-HEMACUE: Hemoglobin: 14.6 g/dL (ref 12.0–15.0)

## 2013-02-21 NOTE — Anesthesia Postprocedure Evaluation (Signed)
Anesthesia Post Note  Patient: [name]  Procedure(s) Performed: Procedure(s) (LRB): RIGHT KNEE ARTHROSCOPY WITH MEDIAL AND LATERAL MENISECTOMY, Abrasion Chondroplasty of medial and lateral femoral condyle, microfracture of medial and lateral condyle, synovectomy of supra patella pouch (Right)  Anesthesia type: General  Patient location: PACU  Post pain: Pain level controlled  Post assessment: Post-op Vital signs reviewed  Last Vitals:  Filed Vitals:   02/20/13 1605  BP: 140/81  Pulse: 96  Temp: 36.6 C  Resp: 16    Post vital signs: Reviewed  Level of consciousness: sedated  Complications: No apparent anesthesia complications

## 2013-02-21 NOTE — Op Note (Signed)
[name], [name]               ACCOUNT NO.:  [id number]  MEDICAL RECORD NO.:  [id number]  LOCATION:                                 FACILITY:  PHYSICIAN:  Georges Lynch. Wilmar Prabhakar, M.D.DATE OF BIRTH:  14-May-1961  DATE OF PROCEDURE:  02/20/2013 DATE OF DISCHARGE:                              OPERATIVE REPORT   SURGEON:  Georges Lynch. Zachariah Pavek, M.D.  ASSISTANT:  OR Tech.  PREOPERATIVE DIAGNOSES: 1. Osteoarthritis of the right knee. 2. Torn medial meniscus, right knee. 3. Torn lateral meniscus, right knee. 4. Chronic synovitis, right knee.  POSTOPERATIVE DIAGNOSES: 1. Osteoarthritis of the right knee. 2. Torn medial meniscus, right knee. 3. Torn lateral meniscus, right knee. 4. Chronic synovitis, right knee.  OPERATION: 1. Diagnostic arthroscopy, right knee. 2. Medial meniscectomy, right knee. 3. Lateral meniscectomy of bucket-handle tear and lateral meniscus,     right knee. 4. Abrasion chondroplasty, medial femoral condyle, right knee. 5. Abrasion chondroplasty, lateral femoral condyle, right knee. 6. Microfracture technique, medial femoral condyle, right knee. 7. Microfracture technique, lateral femoral condyle, right knee. 8. Synovectomy, suprapatellar pouch, right knee and medial joint.  PROCEDURE:  Under general anesthesia with the patient right knee in a knee holder, the appropriate time-out was first carried out.  I also marked the appropriate right leg in the holding area.  At this time, sterile prepping and draping of the knee was carried out.  There was small punctate incision was made in the suprapatellar pouch.  The inflow cannula was inserted.  At this time, I went down into the medial joint first.  She had severe chondromalacia of the medial femoral condyle. The cartilage literally was hanging off like paint off the house.  We did an abrasion chondroplasty first followed by a microfracture technique in the medial femoral condyle.  Also, the posterior horn of the  medial meniscus was torn.  I did a partial medial meniscectomy.  She also had severe synovitis in the medial joint.  I did a synovectomy in the medial joint.  Cruciates were intact.  I went over the lateral joint.  She had severe chondromalacia as well as the lateral femoral condyle and lateral tibial plateau.  I did an abrasion chondroplasty of the lateral femoral condyle.  Following that, I did a microfracture technique on the lateral femoral condyle.  Also noted she had a severe bucket-handle type tear of the lateral meniscus.  I introduced the ArthroCare and did a partial lateral meniscectomy.  I then went up into the suprapatellar pouch.  She had some slight chondromalacia of the patella and severe synovitis.  I simply did a synovectomy and clean out in that area of the suprapatellar pouch.  Thoroughly irrigated out the knee and removed all the fluid and closed all three punctate incisions with 3-0 nylon suture.  I injected 30 mL of 0.25% Marcaine with epinephrine in the knee joint.  Sterile Neosporin dressings were applied.  Postop, she will go on Lovenox tomorrow.  I decided to use Lovenox because of her body weight.  She will start the injections to my office for 7 days and she will go on aspirin 325 mg a day thereafter.  She will  be on partial weightbearing with crutches.  I will put her on Percocet 10/325 1 every 3 hours p.r.n. for pain and I will see her in 10-12 days or prior to if there is any problem.          ______________________________ Georges Lynch [name], M.D.     RAG/MEDQ  D:  02/20/2013  T:  02/21/2013  Job:  [id number]

## 2013-05-13 ENCOUNTER — Encounter: Payer: Self-pay | Admitting: Internal Medicine

## 2013-06-09 ENCOUNTER — Encounter: Payer: Self-pay | Admitting: Internal Medicine

## 2013-06-12 ENCOUNTER — Other Ambulatory Visit (INDEPENDENT_AMBULATORY_CARE_PROVIDER_SITE_OTHER): Payer: Medicare Other

## 2013-06-12 ENCOUNTER — Other Ambulatory Visit: Payer: Self-pay | Admitting: Internal Medicine

## 2013-06-12 ENCOUNTER — Encounter: Payer: Self-pay | Admitting: Internal Medicine

## 2013-06-12 ENCOUNTER — Ambulatory Visit (INDEPENDENT_AMBULATORY_CARE_PROVIDER_SITE_OTHER): Payer: Medicare Other | Admitting: Internal Medicine

## 2013-06-12 VITALS — BP 110/62 | HR 80 | Ht 65.75 in | Wt 215.0 lb

## 2013-06-12 DIAGNOSIS — R109 Unspecified abdominal pain: Secondary | ICD-10-CM

## 2013-06-12 DIAGNOSIS — R197 Diarrhea, unspecified: Secondary | ICD-10-CM

## 2013-06-12 DIAGNOSIS — K219 Gastro-esophageal reflux disease without esophagitis: Secondary | ICD-10-CM

## 2013-06-12 DIAGNOSIS — M797 Fibromyalgia: Secondary | ICD-10-CM | POA: Insufficient documentation

## 2013-06-12 LAB — COMPREHENSIVE METABOLIC PANEL
Albumin: 4 g/dL (ref 3.5–5.2)
BUN: 17 mg/dL (ref 6–23)
Calcium: 10 mg/dL (ref 8.4–10.5)
Chloride: 103 mEq/L (ref 96–112)
Glucose, Bld: 119 mg/dL — ABNORMAL HIGH (ref 70–99)
Potassium: 4.1 mEq/L (ref 3.5–5.1)

## 2013-06-12 LAB — SEDIMENTATION RATE: Sed Rate: 20 mm/hr (ref 0–22)

## 2013-06-12 LAB — CBC
Platelets: 337 10*3/uL (ref 150.0–400.0)
RBC: 4.42 Mil/uL (ref 3.87–5.11)

## 2013-06-12 LAB — TSH: TSH: 4.26 u[IU]/mL (ref 0.35–5.50)

## 2013-06-12 MED ORDER — LOPERAMIDE HCL 2 MG PO CAPS: 2.0000 mg | ORAL_CAPSULE | Freq: Every morning | ORAL | Status: DC

## 2013-06-12 MED ORDER — MOVIPREP 100 G PO SOLR: ORAL | Status: DC

## 2013-06-12 NOTE — Progress Notes (Signed)
Patient ID: [name], female   DOB: Nov 10, 1960, 52 y.o.   MRN: [id number] HPI: [name] is a 52 yo female with a past medical history of fibromyalgia, anxiety, migraines, sleep apnea, GERD, hyperlipidemia and irritable bowel syndrome who is seen in consultation at the request of Dr. Creta Levin to evaluate diarrhea and lower abdominal pain. The patient is here alone today. She reports a "long-standing" history of irritable bowel syndrome for many years. For her this was alternating now habits of diarrhea and constipation. She reports over the last 6 months she has had only diarrhea on a daily basis. This has altered her life and limited her activities. She reports she is going 5-7 times per day and it does wake her from sleep. This has made sleeping more difficult. She reports her stools as watery, but no blood or melena. She has seen scant amounts of red blood on the toilet tissue with wiping which she has attributed to perianal irritation. She has been using Desitin for this. She reports that food tends to trigger her loose stools. She reports her lower abdominal pain, located below the umbilicus which she describes as cramping. This is at times alleviated by defecation. Sometimes she reports feeling like she cannot completely evacuate her rectum and that she still needs to go after having diarrhea. Occasionally her diarrhea will trigger nausea and rarely vomiting. She has tried to alter her diet avoiding foods such as sodas and coffee. This has not seemed to help. She does eat a vegetarian diet but reports a good appetite. Overall weight has been stable. In the past she has tried medications such as Imodium but it has been some time and previously Imodium made her constipated. She endorses borborygmi.  She does report a history of heartburn but this is well-controlled with lansoprazole. She denies dysphagia or diet dysphagia. Occasionally she does have regurgitation with belching.  No new medications in  the past 6 months. Lansoprazole is long-standing as is venlafaxine.  She reports a previous upper endoscopy and colonoscopy performed in [place] around 2007. She recalls this colonoscopy as "normal".  She reports cholecystectomy in 1989  Past Medical History  Diagnosis Date  . Migraine   . Anxiety and depression   . Fibromyalgia   . Family history of anesthesia complication     mom was slow in awakening  . Asthma   . Sleep apnea     uses cpap  . Anxiety   . Palpitations     assoc w anxiety  . IBS (irritable bowel syndrome)   . GERD (gastroesophageal reflux disease)   . Shortness of breath     w/ activities and sometimes when lying flat  . Hyperlipidemia     Past Surgical History  Procedure Laterality Date  . Ectopic pregnancy surgery    . Elbow surgery    . Vaginal hysterectomy    . Hernia repair    . Cholecystectomy    . Knee surgery    . Knee arthroscopy with lateral menisectomy Right 02/20/2013    Procedure: RIGHT KNEE ARTHROSCOPY WITH MEDIAL AND LATERAL MENISECTOMY, Abrasion Chondroplasty of medial and lateral femoral condyle, microfracture of medial and lateral condyle, synovectomy of supra patella pouch;  Surgeon: Jacki Cones, MD;  Location: [facility];  Service: Orthopedics;  Laterality: Right;    Current Outpatient Prescriptions  Medication Sig Dispense Refill  . acetaminophen (TYLENOL) 500 MG tablet Take 500 mg by mouth every 6 (six) hours as needed for  pain.      . hydrOXYzine (VISTARIL) 25 MG capsule Take 1 capsule by mouth daily.      Marland Kitchen ipratropium (ATROVENT HFA) 17 MCG/ACT inhaler Inhale 2 puffs into the lungs every 6 (six) hours.      . lansoprazole (PREVACID) 30 MG capsule Take 1 capsule by mouth daily.      Marland Kitchen oxyCODONE-acetaminophen (PERCOCET) 10-325 MG per tablet Take 1 tablet by mouth every 4 (four) hours as needed for pain.  40 tablet  0  . SUMAtriptan (IMITREX) 25 MG tablet Take 1 tablet by mouth daily.      . traZODone  (DESYREL) 50 MG tablet Take 1 tablet by mouth daily.      Marland Kitchen venlafaxine XR (EFFEXOR-XR) 75 MG 24 hr capsule Take 1 capsule by mouth daily.      . Vitamin D, Ergocalciferol, (DRISDOL) 50000 UNITS CAPS Take 50,000 Units by mouth.      . loperamide (IMODIUM) 2 MG capsule Take 1 capsule (2 mg total) by mouth every morning.  30 capsule  0  . MOVIPREP 100 G SOLR Use per prep instruction  1 kit  0   No current facility-administered medications for this visit.    Allergies  Allergen Reactions  . Aspirin Swelling  . Lyrica [Pregabalin] Other (See Comments)    Wt gain, fluid retention  . Benzocaine Rash    Family History  Problem Relation Age of Onset  . Multiple sclerosis Sister   . Depression    . Diabetes    . Heart attack      History  Substance Use Topics  . Smoking status: Never Smoker   . Smokeless tobacco: Never Used  . Alcohol Use: Yes     Comment: occasionally    ROS: As per history of present illness, otherwise negative  BP 110/62  Pulse 80  Ht 5' 5.75" (1.67 m)  Wt 215 lb (97.523 kg)  BMI 34.97 kg/m2 Constitutional: Well-developed and well-nourished. No distress. HEENT: Normocephalic and atraumatic. Oropharynx is clear and moist. No oropharyngeal exudate. Conjunctivae are normal.  No scleral icterus. Neck: Neck supple. Trachea midline. Cardiovascular: Normal rate, regular rhythm and intact distal pulses. No M/R/G Pulmonary/chest: Effort normal and breath sounds normal. No wheezing, rales or rhonchi. Abdominal: Soft, nontender, nondistended. Bowel sounds active throughout. Well healed diagonal right upper quadrant scar Extremities: no clubbing, cyanosis, or edema Neurological: Alert and oriented to person place and time. Skin: Skin is warm and dry. No rashes noted. Psychiatric: Normal mood and affect. Behavior is normal.  RELEVANT LABS AND IMAGING: CBC    Component Value Date/Time   HGB 14.6 02/20/2013 1129   CMP dated 04/21/2013 Within normal limits except  for creatinine slightly elevated at 1.2, GFR 47  ASSESSMENT/PLAN: 52 yo female with a past medical history of fibromyalgia, anxiety, migraines, sleep apnea, GERD, hyperlipidemia and irritable bowel syndrome who is seen in consultation at the request of Dr. Creta Levin to evaluate diarrhea and lower abdominal pain.  1.  Chronic diarrhea/lower abd pain -- we discussed her diarrhea today and the differential includes irritable bowel with diarrhea predominance, microscopic colitis, medication-induced diarrhea, bile salt diarrhea, infectious diarrhea, or less likely inflammatory bowel disease. I have recommended stool studies for infection but also fecal leukocytes and fecal elastase. I will check labs today to include CMP, CBC, TSH, celiac panel, and sedimentation rate. Assuming these do not yield diagnosis, we will proceed with colonoscopy to evaluate chronic diarrhea rule out microscopic colitis. We discussed the test today  including the risks and benefits and she is agreeable to proceed. She was asked to call her insurance company to see what her financial responsibility will be.  After submitting stool studies, I have advised that she try loperamide 2 mg every morning. I would like to see if this significantly reduces her diarrhea. She is asked to call me if it is not or she develops constipation. She voices understanding.  2.  GERD -- well-controlled. Will continue lansoprazole 30 mg daily for now. We discussed how lansoprazole can cause diarrhea, but she feels that this medication predates the worsening of her diarrhea.

## 2013-06-12 NOTE — Patient Instructions (Signed)
Your physician has requested that you go to the basement for lab work before leaving today  We have sent the following medications to your pharmacy for you to pick up at your convenience: Movie prep, Imodium  You have been scheduled for a colonoscopy with propofol. Please follow written instructions given to you at your visit today.  Please pick up your prep kit at the pharmacy within the next 1-3 days. If you use inhalers (even only as needed), please bring them with you on the day of your procedure. Your physician has requested that you go to www.startemmi.com and enter the access code given to you at your visit today. This web site gives a general overview about your procedure. However, you should still follow specific instructions given to you by our office regarding your preparation for the procedure.                                               We are excited to introduce MyChart, a new best-in-class service that provides you online access to important information in your electronic medical record. We want to make it easier for you to view your health information - all in one secure location - when and where you need it. We expect MyChart will enhance the quality of care and service we provide.  When you register for MyChart, you can:    View your test results.    Request appointments and receive appointment reminders via email.    Request medication renewals.    View your medical history, allergies, medications and immunizations.    Communicate with your physician's office through a password-protected site.    Conveniently print information such as your medication lists.  To find out if MyChart is right for you, please talk to a member of our clinical staff today. We will gladly answer your questions about this free health and wellness tool.  If you are age 11 or older and want a member of your family to have access to your record, you must provide written consent by completing a  proxy form available at our office. Please speak to our clinical staff about guidelines regarding accounts for patients younger than age 14.  As you activate your MyChart account and need any technical assistance, please call the MyChart technical support line at [phone]) [phone]-CHART [phone]) or email your question to [email or web address]. If you email your question(s), please include your name, a return phone number and the best time to reach you.  If you have non-urgent health-related questions, you can send a message to our office through MyChart at [email or web address]. If you have a medical emergency, call 911.  Thank you for using MyChart as your new health and wellness resource!   MyChart licensed from [employer],  [id number]. Patents Pending.

## 2013-06-13 ENCOUNTER — Encounter: Payer: Self-pay | Admitting: Internal Medicine

## 2013-06-13 LAB — OVA AND PARASITE SCREEN: OP: NONE SEEN

## 2013-06-13 LAB — FECAL LACTOFERRIN, QUANT: Lactoferrin: NEGATIVE

## 2013-06-15 LAB — CLOSTRIDIUM DIFFICILE BY PCR: Toxigenic C. Difficile by PCR: NOT DETECTED

## 2013-06-23 ENCOUNTER — Encounter: Payer: Self-pay | Admitting: Internal Medicine

## 2013-06-23 ENCOUNTER — Ambulatory Visit (AMBULATORY_SURGERY_CENTER): Payer: Medicare Other | Admitting: Internal Medicine

## 2013-06-23 VITALS — BP 97/67 | HR 73 | Temp 96.9°F | Resp 19 | Ht 65.75 in | Wt 215.0 lb

## 2013-06-23 DIAGNOSIS — R197 Diarrhea, unspecified: Secondary | ICD-10-CM

## 2013-06-23 DIAGNOSIS — D126 Benign neoplasm of colon, unspecified: Secondary | ICD-10-CM

## 2013-06-23 MED ORDER — SODIUM CHLORIDE 0.9 % IV SOLN: 500.0000 mL | INTRAVENOUS | Status: DC

## 2013-06-23 MED ORDER — DIPHENOXYLATE-ATROPINE 2.5-0.025 MG PO TABS: 1.0000 | ORAL_TABLET | Freq: Three times a day (TID) | ORAL | Status: DC | PRN

## 2013-06-23 NOTE — Progress Notes (Signed)
Called to room to assist during endoscopic procedure.  Patient ID and intended procedure confirmed with present staff. Received instructions for my participation in the procedure from the performing physician.Called to room to assist during endoscopic procedure.  ewm

## 2013-06-23 NOTE — Progress Notes (Signed)
Patient did not experience any of the following events: a burn prior to discharge; a fall within the facility; wrong site/side/patient/procedure/implant event; or a hospital transfer or hospital admission upon discharge from the facility. (G8907) Patient did not have preoperative order for IV antibiotic SSI prophylaxis. (G8918)  

## 2013-06-23 NOTE — Progress Notes (Signed)
Report to pacu rn, vss, bbs=clear, preop had her 3 puffs of albuterol, has Hx asthma.

## 2013-06-23 NOTE — Op Note (Signed)
[facility] [address] [place], [address]   COLONOSCOPY PROCEDURE REPORT  PATIENT: Daylen, Hack  MR#: [id number] BIRTHDATE: 1961-03-21 , 52  yrs. old GENDER: Female ENDOSCOPIST: Beverley Fiedler, MD REFERRED XB:JYNWGN Buckner Malta, M.D. PROCEDURE DATE:  06/23/2013 PROCEDURE:   Colonoscopy with snare polypectomy, Colonoscopy with cold biopsy polypectomy, and Colonoscopy with biopsy First Screening Colonoscopy - Avg.  risk and is 50 yrs.  old or older - No.  Prior Negative Screening - Now for repeat screening. Other: See Comments  History of Adenoma - Now for follow-up colonoscopy & has been > or = to 3 yrs.  N/A  Polyps Removed Today? Yes. ASA CLASS:   Class III INDICATIONS:unexplained diarrhea and Abdominal pain. MEDICATIONS: MAC sedation, administered by CRNA and Propofol (Diprivan) 480 mg IV  DESCRIPTION OF PROCEDURE:   After the risks benefits and alternatives of the procedure were thoroughly explained, informed consent was obtained.  A digital rectal exam revealed no rectal mass.   The LB [id number]  endoscope was introduced through the anus and advanced to the terminal ileum which was intubated for a short distance. No adverse events experienced.   The quality of the prep was good, using MoviPrep  The instrument was then slowly withdrawn as the colon was fully examined.   COLON FINDINGS: The mucosa appeared normal in the terminal ileum. Three sessile polyps measuring 4-8 mm in size were found at the hepatic flexure and in the proximal transverse colon.  Polypectomy was performed using cold snare (2) and using hot snare (1).  All resections were complete and all polyp tissue was completely retrieved.   Six sessile polyps ranging between 3-88mm in size were found in the descending colon (2), sigmoid colon (3), and rectum (1).  Polypectomy was performed with cold forceps (2) and using cold snare (4).  All resections were complete and all polyp  tissue was completely retrieved.   The colon mucosa was otherwise normal. Multiple biopsies taken from right and left colon to rule out microscopic colitis. Retroflexed views revealed external hemorrhoids. The time to cecum=4 minutes 55 seconds.  Withdrawal time=22 minutes 53 seconds.  The scope was withdrawn and the procedure completed.  COMPLICATIONS: There were no complications.  ENDOSCOPIC IMPRESSION: 1.   Normal mucosa in the terminal ileum 2.   Three sessile polyps measuring 4-8 mm in size were found at the hepatic flexure and in the proximal transverse colon; Polypectomy was performed using cold snare and using hot snare 3.   Six sessile polyps ranging between 3-36mm in size were found in the descending colon, sigmoid colon, and rectum; Polypectomy was performed with cold forceps and using cold snare 4.   The colon mucosa was otherwise normal; biopsies to exclude microscopic colitis  RECOMMENDATIONS: 1.  Hold aspirin, aspirin products, and anti-inflammatory medication for 2 weeks. 2.  Await pathology results 3.  Timing of repeat colonoscopy will be determined by pathology findings. 4.  You will receive a letter within 1-2 weeks with the results of your biopsy as well as final recommendations.  Please call my office if you have not received a letter after 3 weeks.   eSigned:  Beverley Fiedler, MD 06/23/2013 12:28 PM   cc: The Patient and Halina Maidens, MD   PATIENT NAME:  Salayah, Meares MR#: [id number]

## 2013-06-23 NOTE — Patient Instructions (Signed)

## 2013-06-24 ENCOUNTER — Telehealth: Payer: Self-pay

## 2013-06-24 NOTE — Telephone Encounter (Signed)
Reached recording number invalid

## 2013-06-29 ENCOUNTER — Encounter: Payer: Self-pay | Admitting: Internal Medicine

## 2013-07-01 ENCOUNTER — Telehealth: Payer: Self-pay | Admitting: *Deleted

## 2013-07-01 MED ORDER — CHOLESTYRAMINE 4 G PO PACK: PACK | ORAL | Status: DC

## 2013-07-01 NOTE — Telephone Encounter (Signed)
Clinical Letter Summary    Letter from: Beverley Fiedler   Reason for Letter: Results Review   Comments: repeat colonoscopy in 3 years   Aram Beecham  Please call pt to see if diarrhea persists. If so, I may try cholestyramine      Pt reports she is still bothered with about 6 watery stools a day. Informed her I will order Cholestyramine for her. She has a f/u with Dr Rhea Belton for 07/21/13 but stated understanding she can be seen by APP earlier if she needs to.

## 2013-07-03 ENCOUNTER — Other Ambulatory Visit: Payer: Self-pay

## 2013-07-07 NOTE — Telephone Encounter (Signed)
Agree with every other day cholestyramine Would hold all other anti-diarrheas.  No loperamide or lomotil JMP

## 2013-07-07 NOTE — Telephone Encounter (Signed)
Beverley Fiedler, MD Linna Hoff, RN            Start with 1, can add 2nd dose if necessary. Should not need anti-diarrheal with this med (i.e. Loperamide or lomotil)      Spoke with pt and she is having trouble with the cholestyramine; she either has the diarrhea when she doesn't take it or she gets constipated when she takes it. Suggested pt take it maybe every other day. Any advice? Thanks.

## 2013-07-08 ENCOUNTER — Emergency Department (HOSPITAL_COMMUNITY): Payer: Medicare Other

## 2013-07-08 ENCOUNTER — Inpatient Hospital Stay (HOSPITAL_COMMUNITY)
Admission: EM | Admit: 2013-07-08 | Discharge: 2013-07-10 | DRG: 392 | Disposition: A | Discharge status: Home / Self Care | Payer: Medicare Other | Attending: Internal Medicine | Admitting: Internal Medicine

## 2013-07-08 ENCOUNTER — Encounter (HOSPITAL_COMMUNITY): Payer: Self-pay | Admitting: Emergency Medicine

## 2013-07-08 DIAGNOSIS — Z886 Allergy status to analgesic agent status: Secondary | ICD-10-CM

## 2013-07-08 DIAGNOSIS — R251 Tremor, unspecified: Secondary | ICD-10-CM

## 2013-07-08 DIAGNOSIS — R197 Diarrhea, unspecified: Secondary | ICD-10-CM

## 2013-07-08 DIAGNOSIS — E876 Hypokalemia: Secondary | ICD-10-CM | POA: Diagnosis present

## 2013-07-08 DIAGNOSIS — G43909 Migraine, unspecified, not intractable, without status migrainosus: Secondary | ICD-10-CM | POA: Diagnosis present

## 2013-07-08 DIAGNOSIS — Z803 Family history of malignant neoplasm of breast: Secondary | ICD-10-CM

## 2013-07-08 DIAGNOSIS — E874 Mixed disorder of acid-base balance: Secondary | ICD-10-CM | POA: Diagnosis present

## 2013-07-08 DIAGNOSIS — E872 Acidosis: Secondary | ICD-10-CM

## 2013-07-08 DIAGNOSIS — R002 Palpitations: Secondary | ICD-10-CM | POA: Diagnosis present

## 2013-07-08 DIAGNOSIS — E860 Dehydration: Secondary | ICD-10-CM | POA: Diagnosis present

## 2013-07-08 DIAGNOSIS — R531 Weakness: Secondary | ICD-10-CM | POA: Diagnosis present

## 2013-07-08 DIAGNOSIS — R112 Nausea with vomiting, unspecified: Secondary | ICD-10-CM | POA: Diagnosis present

## 2013-07-08 DIAGNOSIS — R7309 Other abnormal glucose: Secondary | ICD-10-CM | POA: Diagnosis present

## 2013-07-08 DIAGNOSIS — K5900 Constipation, unspecified: Secondary | ICD-10-CM | POA: Diagnosis present

## 2013-07-08 DIAGNOSIS — G4733 Obstructive sleep apnea (adult) (pediatric): Secondary | ICD-10-CM | POA: Diagnosis present

## 2013-07-08 DIAGNOSIS — Z833 Family history of diabetes mellitus: Secondary | ICD-10-CM

## 2013-07-08 DIAGNOSIS — R42 Dizziness and giddiness: Secondary | ICD-10-CM | POA: Diagnosis present

## 2013-07-08 DIAGNOSIS — E869 Volume depletion, unspecified: Secondary | ICD-10-CM | POA: Diagnosis present

## 2013-07-08 DIAGNOSIS — IMO0001 Reserved for inherently not codable concepts without codable children: Secondary | ICD-10-CM | POA: Diagnosis present

## 2013-07-08 DIAGNOSIS — F411 Generalized anxiety disorder: Secondary | ICD-10-CM | POA: Diagnosis present

## 2013-07-08 DIAGNOSIS — Z860100 Personal history of colon polyps, unspecified: Secondary | ICD-10-CM

## 2013-07-08 DIAGNOSIS — M797 Fibromyalgia: Secondary | ICD-10-CM | POA: Diagnosis present

## 2013-07-08 DIAGNOSIS — F329 Major depressive disorder, single episode, unspecified: Secondary | ICD-10-CM | POA: Diagnosis present

## 2013-07-08 DIAGNOSIS — K219 Gastro-esophageal reflux disease without esophagitis: Secondary | ICD-10-CM | POA: Diagnosis present

## 2013-07-08 DIAGNOSIS — N179 Acute kidney failure, unspecified: Secondary | ICD-10-CM | POA: Diagnosis present

## 2013-07-08 DIAGNOSIS — J45909 Unspecified asthma, uncomplicated: Secondary | ICD-10-CM | POA: Diagnosis present

## 2013-07-08 DIAGNOSIS — Z8249 Family history of ischemic heart disease and other diseases of the circulatory system: Secondary | ICD-10-CM

## 2013-07-08 DIAGNOSIS — M1711 Unilateral primary osteoarthritis, right knee: Secondary | ICD-10-CM

## 2013-07-08 DIAGNOSIS — Z79899 Other long term (current) drug therapy: Secondary | ICD-10-CM

## 2013-07-08 DIAGNOSIS — K589 Irritable bowel syndrome without diarrhea: Principal | ICD-10-CM | POA: Diagnosis present

## 2013-07-08 DIAGNOSIS — R109 Unspecified abdominal pain: Secondary | ICD-10-CM

## 2013-07-08 DIAGNOSIS — E785 Hyperlipidemia, unspecified: Secondary | ICD-10-CM | POA: Diagnosis present

## 2013-07-08 DIAGNOSIS — F3289 Other specified depressive episodes: Secondary | ICD-10-CM | POA: Diagnosis present

## 2013-07-08 DIAGNOSIS — R200 Anesthesia of skin: Secondary | ICD-10-CM

## 2013-07-08 LAB — COMPREHENSIVE METABOLIC PANEL WITH GFR
ALT: 17 U/L (ref 0–35)
AST: 35 U/L (ref 0–37)
Albumin: 4.3 g/dL (ref 3.5–5.2)
Alkaline Phosphatase: 113 U/L (ref 39–117)
BUN: 15 mg/dL (ref 6–23)
CO2: 8 meq/L — CL (ref 19–32)
Calcium: 9.8 mg/dL (ref 8.4–10.5)
Chloride: 94 meq/L — ABNORMAL LOW (ref 96–112)
Creatinine, Ser: 1.57 mg/dL — ABNORMAL HIGH (ref 0.50–1.10)
GFR calc Af Amer: 43 mL/min — ABNORMAL LOW
GFR calc non Af Amer: 37 mL/min — ABNORMAL LOW
Glucose, Bld: 102 mg/dL — ABNORMAL HIGH (ref 70–99)
Potassium: 4 meq/L (ref 3.5–5.1)
Sodium: 135 meq/L (ref 135–145)
Total Bilirubin: 0.3 mg/dL (ref 0.3–1.2)
Total Protein: 8.2 g/dL (ref 6.0–8.3)

## 2013-07-08 LAB — CBC WITH DIFFERENTIAL/PLATELET
Basophils Absolute: 0 K/uL (ref 0.0–0.1)
Basophils Relative: 0 % (ref 0–1)
Eosinophils Absolute: 0 K/uL (ref 0.0–0.7)
Eosinophils Relative: 0 % (ref 0–5)
HCT: 43.5 % (ref 36.0–46.0)
Hemoglobin: 14.7 g/dL (ref 12.0–15.0)
Lymphocytes Relative: 9 % — ABNORMAL LOW (ref 12–46)
Lymphs Abs: 0.8 K/uL (ref 0.7–4.0)
MCH: 32 pg (ref 26.0–34.0)
MCHC: 33.8 g/dL (ref 30.0–36.0)
MCV: 94.6 fL (ref 78.0–100.0)
Monocytes Absolute: 0.8 K/uL (ref 0.1–1.0)
Monocytes Relative: 8 % (ref 3–12)
Neutro Abs: 7.8 K/uL — ABNORMAL HIGH (ref 1.7–7.7)
Neutrophils Relative %: 83 % — ABNORMAL HIGH (ref 43–77)
Platelets: 365 K/uL (ref 150–400)
RBC: 4.6 MIL/uL (ref 3.87–5.11)
RDW: 13.1 % (ref 11.5–15.5)
WBC: 9.4 K/uL (ref 4.0–10.5)

## 2013-07-08 LAB — BASIC METABOLIC PANEL
BUN: 10 mg/dL (ref 6–23)
CO2: 16 mEq/L — ABNORMAL LOW (ref 19–32)
Calcium: 9.2 mg/dL (ref 8.4–10.5)
Chloride: 106 mEq/L (ref 96–112)
GFR calc non Af Amer: 53 mL/min — ABNORMAL LOW (ref >90)
Glucose, Bld: 201 mg/dL — ABNORMAL HIGH (ref 70–99)
Potassium: 4.4 mEq/L (ref 3.5–5.1)

## 2013-07-08 LAB — URINE MICROSCOPIC-ADD ON

## 2013-07-08 LAB — BLOOD GAS, VENOUS
Acid-base deficit: 20.8 mmol/L — ABNORMAL HIGH (ref 0.0–2.0)
Bicarbonate: 8.1 mEq/L — ABNORMAL LOW (ref 20.0–24.0)
FIO2: 0.21 %
O2 Saturation: 64.8 %
pCO2, Ven: 26.4 mmHg — ABNORMAL LOW (ref 45.0–50.0)
pH, Ven: 7.112 — CL (ref 7.250–7.300)
pO2, Ven: 34.9 mmHg (ref 30.0–45.0)

## 2013-07-08 LAB — URINALYSIS, ROUTINE W REFLEX MICROSCOPIC
Glucose, UA: NEGATIVE mg/dL
Ketones, ur: >80 mg/dL — AB
Nitrite: NEGATIVE
Protein, ur: 100 mg/dL — AB
Specific Gravity, Urine: 1.027 (ref 1.005–1.030)
Urobilinogen, UA: 0.2 mg/dL (ref 0.0–1.0)
pH: 5.5 (ref 5.0–8.0)

## 2013-07-08 LAB — CG4 I-STAT (LACTIC ACID): Lactic Acid, Venous: 1.2 mmol/L (ref 0.5–2.2)

## 2013-07-08 LAB — CK: Total CK: 120 U/L (ref 7–177)

## 2013-07-08 LAB — ACETAMINOPHEN LEVEL: Acetaminophen (Tylenol), Serum: <15 ug/mL (ref 10–30)

## 2013-07-08 LAB — ETHANOL: Alcohol, Ethyl (B): <11 mg/dL (ref 0–11)

## 2013-07-08 LAB — TROPONIN I: Troponin I: <0.3 ng/mL (ref <0.30)

## 2013-07-08 LAB — SALICYLATE LEVEL: Salicylate Lvl: <2 mg/dL — ABNORMAL LOW (ref 2.8–20.0)

## 2013-07-08 LAB — LIPASE, BLOOD: Lipase: 43 U/L (ref 11–59)

## 2013-07-08 MED ORDER — CLONAZEPAM 0.5 MG PO TABS
0.5000 mg | ORAL_TABLET | Freq: Three times a day (TID) | ORAL | Status: DC | PRN
Start: 2013-07-08 — End: 2013-07-10

## 2013-07-08 MED ORDER — ONDANSETRON HCL 4 MG/2ML IJ SOLN
4.0000 mg | Freq: Four times a day (QID) | INTRAMUSCULAR | Status: DC | PRN
  Administered 2013-07-08 – 2013-07-09 (×2): 4 mg via INTRAVENOUS
  Filled 2013-07-08 (×2): qty 2

## 2013-07-08 MED ORDER — CHOLESTYRAMINE 4 G PO PACK
4.0000 g | PACK | Freq: Three times a day (TID) | ORAL | Status: DC
  Administered 2013-07-08: 4 g via ORAL
  Filled 2013-07-08 (×3): qty 1

## 2013-07-08 MED ORDER — MECLIZINE HCL 25 MG PO TABS
25.0000 mg | ORAL_TABLET | Freq: Three times a day (TID) | ORAL | Status: DC
  Administered 2013-07-08 – 2013-07-10 (×6): 25 mg via ORAL
  Filled 2013-07-08 (×8): qty 1

## 2013-07-08 MED ORDER — LOPERAMIDE HCL 2 MG PO CAPS: 2.0000 mg | ORAL_CAPSULE | ORAL | Status: DC | PRN

## 2013-07-08 MED ORDER — FAMOTIDINE IN NACL 20-0.9 MG/50ML-% IV SOLN
20.0000 mg | Freq: Two times a day (BID) | INTRAVENOUS | Status: DC
  Filled 2013-07-08: qty 50

## 2013-07-08 MED ORDER — IOHEXOL 300 MG/ML  SOLN
50.0000 mL | Freq: Once | INTRAMUSCULAR | Status: AC | PRN
  Administered 2013-07-08: 50 mL via ORAL

## 2013-07-08 MED ORDER — CHLORPROMAZINE HCL 25 MG/ML IJ SOLN
25.0000 mg | Freq: Three times a day (TID) | INTRAMUSCULAR | Status: DC | PRN
  Filled 2013-07-08: qty 1

## 2013-07-08 MED ORDER — ATORVASTATIN CALCIUM 10 MG PO TABS
10.0000 mg | ORAL_TABLET | Freq: Every day | ORAL | Status: DC
  Administered 2013-07-08 – 2013-07-09 (×2): 10 mg via ORAL
  Filled 2013-07-08 (×3): qty 1

## 2013-07-08 MED ORDER — PROMETHAZINE HCL 25 MG/ML IJ SOLN: 12.5000 mg | Freq: Once | INTRAMUSCULAR | Status: DC

## 2013-07-08 MED ORDER — ONDANSETRON HCL 4 MG/2ML IJ SOLN
4.0000 mg | Freq: Once | INTRAMUSCULAR | Status: AC
  Administered 2013-07-08: 4 mg via INTRAVENOUS
  Filled 2013-07-08: qty 2

## 2013-07-08 MED ORDER — SUMATRIPTAN SUCCINATE 25 MG PO TABS
25.0000 mg | ORAL_TABLET | ORAL | Status: DC | PRN
  Administered 2013-07-08 – 2013-07-09 (×3): 25 mg via ORAL
  Filled 2013-07-08 (×3): qty 1

## 2013-07-08 MED ORDER — PANTOPRAZOLE SODIUM 40 MG IV SOLR
40.0000 mg | Freq: Once | INTRAVENOUS | Status: AC
  Administered 2013-07-08: 40 mg via INTRAVENOUS
  Filled 2013-07-08: qty 40

## 2013-07-08 MED ORDER — HEPARIN SODIUM (PORCINE) 5000 UNIT/ML IJ SOLN
5000.0000 [IU] | Freq: Three times a day (TID) | INTRAMUSCULAR | Status: DC
  Administered 2013-07-08 – 2013-07-10 (×5): 5000 [IU] via SUBCUTANEOUS
  Filled 2013-07-08 (×8): qty 1

## 2013-07-08 MED ORDER — SODIUM BICARBONATE 8.4 % IV SOLN
INTRAVENOUS | Status: DC
  Administered 2013-07-08 (×2): via INTRAVENOUS
  Filled 2013-07-08 (×9): qty 1000

## 2013-07-08 MED ORDER — FAMOTIDINE IN NACL 20-0.9 MG/50ML-% IV SOLN
20.0000 mg | Freq: Once | INTRAVENOUS | Status: AC
  Administered 2013-07-08: 20 mg via INTRAVENOUS
  Filled 2013-07-08: qty 50

## 2013-07-08 MED ORDER — TEMAZEPAM 15 MG PO CAPS: 15.0000 mg | ORAL_CAPSULE | Freq: Every evening | ORAL | Status: DC | PRN

## 2013-07-08 MED ORDER — METOCLOPRAMIDE HCL 5 MG/ML IJ SOLN
10.0000 mg | Freq: Once | INTRAMUSCULAR | Status: AC
  Administered 2013-07-08: 10 mg via INTRAVENOUS
  Filled 2013-07-08: qty 2

## 2013-07-08 MED ORDER — SODIUM BICARBONATE 8.4 % IV SOLN: INTRAVENOUS | Status: DC

## 2013-07-08 MED ORDER — SODIUM CHLORIDE 0.9 % IV SOLN
Freq: Once | INTRAVENOUS | Status: AC
  Administered 2013-07-08: 02:00:00 via INTRAVENOUS

## 2013-07-08 MED ORDER — SODIUM CHLORIDE 0.9 % IV BOLUS (SEPSIS): 1000.0000 mL | Freq: Once | INTRAVENOUS | Status: DC

## 2013-07-08 MED ORDER — SODIUM CHLORIDE 0.9 % IV SOLN
Freq: Once | INTRAVENOUS | Status: AC
  Administered 2013-07-08: 01:00:00 via INTRAVENOUS

## 2013-07-08 MED ORDER — IOHEXOL 300 MG/ML  SOLN
80.0000 mL | Freq: Once | INTRAMUSCULAR | Status: AC | PRN
  Administered 2013-07-08: 80 mL via INTRAVENOUS

## 2013-07-08 MED ORDER — TRAZODONE HCL 50 MG PO TABS
50.0000 mg | ORAL_TABLET | Freq: Every day | ORAL | Status: DC
  Administered 2013-07-08 – 2013-07-09 (×2): 50 mg via ORAL
  Filled 2013-07-08 (×3): qty 1

## 2013-07-08 MED ORDER — VENLAFAXINE HCL ER 75 MG PO CP24
75.0000 mg | ORAL_CAPSULE | Freq: Every day | ORAL | Status: DC
  Administered 2013-07-08 – 2013-07-10 (×3): 75 mg via ORAL
  Filled 2013-07-08 (×3): qty 1

## 2013-07-08 MED ORDER — DEXAMETHASONE SODIUM PHOSPHATE 10 MG/ML IJ SOLN
10.0000 mg | Freq: Once | INTRAMUSCULAR | Status: AC
  Administered 2013-07-08: 10 mg via INTRAVENOUS
  Filled 2013-07-08: qty 1

## 2013-07-08 MED ORDER — PANTOPRAZOLE SODIUM 40 MG PO TBEC
40.0000 mg | DELAYED_RELEASE_TABLET | Freq: Two times a day (BID) | ORAL | Status: DC
  Administered 2013-07-08 – 2013-07-10 (×4): 40 mg via ORAL
  Filled 2013-07-08 (×6): qty 1

## 2013-07-08 NOTE — H&P (Signed)
[facility] History and Physical  [name] QMV:[id number] DOB: [birth year]    PCP:   Warrick Parisian, MD   Chief Complaint: diarrhea and weakness.  HPI: [name] is an 52 y.o. 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  She has been seeing Dr Rhea Belton and had a complete work up to exclude celiac disease, malabsorption, IBD, thyroid disease, infectious disease.  She was found in the ER to have severe metabolic acidosis, with bicarb of 8, and venous pH of 7.1.  She has been feeling a little short of breath as well.  There has been no fever, chills, black or bloody stools.  She was given IVF and felt better.  Her abdominal pelvic CT was unremarkable (GERD changes, and fatty liver changes).  Hospitalist was asked to admit her because of volume depletion with acidosis.  Rewiew of Systems:  Constitutional: Negative for malaise, fever and chills. No significant weight loss or weight gain Eyes: Negative for eye pain, redness and discharge, diplopia, visual changes, or flashes of light. ENMT: Negative for ear pain, hoarseness, nasal congestion, sinus pressure and sore throat. No headaches; tinnitus, drooling, or problem swallowing. Cardiovascular: Negative for chest pain, palpitations, diaphoresis, dyspnea and peripheral edema. ; No orthopnea, PND Respiratory: Negative for cough, hemoptysis, wheezing and stridor. No pleuritic chestpain. Gastrointestinal: Negative for nausea, vomiting, constipation, melena, blood in stool, hematemesis, jaundice and rectal bleeding.    Genitourinary: Negative for frequency, dysuria, incontinence,flank pain and hematuria; Musculoskeletal: Negative for back pain and neck pain. Negative for swelling and trauma.;  Skin: . Negative for pruritus, rash, abrasions, bruising and skin lesion.; ulcerations Neuro: Negative  for headache, lightheadedness and neck stiffness. Negative for weakness, altered level of consciousness , altered mental status, extremity weakness, burning feet, involuntary movement, seizure and syncope.  Psych: negative for  insomnia, tearfulness, panic attacks, hallucinations, paranoia, suicidal or homicidal ideation .   Past Medical History  Diagnosis Date  . Migraine   . Anxiety and depression   . Fibromyalgia   . Family history of anesthesia complication     mom was slow in awakening  . Asthma   . Sleep apnea     uses cpap  . Anxiety   . Palpitations     assoc w anxiety  . IBS (irritable bowel syndrome)   . GERD (gastroesophageal reflux disease)   . Shortness of breath     w/ activities and sometimes when lying flat  . Hyperlipidemia     Past Surgical History  Procedure Laterality Date  . Ectopic pregnancy surgery    . Elbow surgery    . Vaginal hysterectomy    . Hernia repair    . Cholecystectomy    . Knee surgery    . Knee arthroscopy with lateral menisectomy Right 02/20/2013    Procedure: RIGHT KNEE ARTHROSCOPY WITH MEDIAL AND LATERAL MENISECTOMY, Abrasion Chondroplasty of medial and lateral femoral condyle, microfracture of medial and lateral condyle, synovectomy of supra patella pouch;  Surgeon: Jacki Cones, MD;  Location: [facility];  Service: Orthopedics;  Laterality: Right;    Medications:  HOME MEDS: Prior to Admission medications   Medication Sig Start Date End Date Taking? Authorizing Provider  acetaminophen (TYLENOL) 500 MG tablet Take 500 mg by mouth every 6 (six) hours as needed for pain.   Yes Historical Provider, MD  cholestyramine Lanetta Inch) 4 G packet Take one packet  daily before a meal and take at least  2 hours before other medications to avoid decreased absorption . If one packet does not help with diarrhea, may increase to twice daily, DO NOT TAKE ANTI DIARRHEALS LIKE IMODIUM OR LOMOTIL WHILE TAKING CHOLESTYRAMINE. 07/01/13   Yes Beverley Fiedler, MD  clonazePAM (KLONOPIN) 0.5 MG tablet Take by mouth 3 (three) times daily. 06/08/13  Yes Historical Provider, MD  CRESTOR 5 MG tablet 1 day or 1 dose. 06/07/13  Yes Historical Provider, MD  hydrOXYzine (VISTARIL) 25 MG capsule Take 1 capsule by mouth daily. 01/22/13  Yes Historical Provider, MD  ipratropium (ATROVENT HFA) 17 MCG/ACT inhaler Inhale 2 puffs into the lungs every 6 (six) hours.   Yes Historical Provider, MD  SUMAtriptan (IMITREX) 25 MG tablet Take 1 tablet by mouth daily. 01/22/13  Yes Historical Provider, MD  temazepam (RESTORIL) 15 MG capsule 1 day or 1 dose. 04/21/13  Yes Historical Provider, MD  traZODone (DESYREL) 50 MG tablet Take 1 tablet by mouth daily. 10/22/12  Yes Historical Provider, MD  venlafaxine XR (EFFEXOR-XR) 75 MG 24 hr capsule Take 1 capsule by mouth daily. 01/22/13  Yes Historical Provider, MD  Vitamin D, Ergocalciferol, (DRISDOL) 50000 UNITS CAPS Take 50,000 Units by mouth.   Yes Historical Provider, MD     Allergies:  Allergies  Allergen Reactions  . Aspirin Swelling  . Lyrica [Pregabalin] Other (See Comments)    Wt gain, fluid retention  . Benzocaine Rash    Social History:   reports that she has never smoked. She has never used smokeless tobacco. She reports that she drinks alcohol. She reports that she does not use illicit drugs.  Family History: Family History  Problem Relation Age of Onset  . Multiple sclerosis Sister   . Depression    . Diabetes    . Heart attack    . Breast cancer Paternal Grandmother   . Colon cancer Neg Hx   . Esophageal cancer Neg Hx   . Stomach cancer Neg Hx      Physical Exam: Filed Vitals:   07/08/13 0022 07/08/13 0210  BP: 141/95 123/81  Pulse: 106 100  Temp: 98.5 F (36.9 C)   TempSrc: Oral   Resp: 18   SpO2: 99% 100%   Blood pressure 123/81, pulse 100, temperature 98.5 F (36.9 C), temperature source Oral, resp. rate 18, SpO2 100.00%.  GEN:  Pleasant patient lying in the stretcher  in no acute distress; cooperative with exam. PSYCH:  alert and oriented x4; does not appear anxious or depressed; affect is appropriate. HEENT: Mucous membranes pink and anicteric; PERRLA; EOM intact; no cervical lymphadenopathy nor thyromegaly or carotid bruit; no JVD; There were no stridor. Neck is very supple. Breasts:: Not examined CHEST WALL: No tenderness CHEST: Normal respiration, clear to auscultation bilaterally.  HEART: Regular rate and rhythm.  There are no murmur, rub, or gallops.   BACK: No kyphosis or scoliosis; no CVA tenderness ABDOMEN: soft and non-tender; no masses, no organomegaly, normal abdominal bowel sounds; no pannus; no intertriginous candida. There is no rebound and no distention. Rectal Exam: Not done EXTREMITIES: No bone or joint deformity; age-appropriate arthropathy of the hands and knees; no edema; no ulcerations.  There is no calf tenderness. Genitalia: not examined PULSES: 2+ and symmetric SKIN: Normal hydration no rash or ulceration CNS: Cranial nerves 2-12 grossly intact no focal lateralizing neurologic deficit.  Speech is fluent; uvula elevated with phonation, facial symmetry and tongue midline. DTR are normal bilaterally, cerebella  exam is intact, barbinski is negative and strengths are equaled bilaterally.  No sensory loss.   Labs on Admission:  Basic Metabolic Panel:  Recent Labs Lab 07/08/13 0030  NA 135  K 4.0  CL 94*  CO2 8*  GLUCOSE 102*  BUN 15  CREATININE 1.57*  CALCIUM 9.8   Liver Function Tests:  Recent Labs Lab 07/08/13 0030  AST 35  ALT 17  ALKPHOS 113  BILITOT 0.3  PROT 8.2  ALBUMIN 4.3    Recent Labs Lab 07/08/13 0030  LIPASE 43   No results found for this basename: AMMONIA,  in the last 168 hours CBC:  Recent Labs Lab 07/08/13 0030  WBC 9.4  NEUTROABS 7.8*  HGB 14.7  HCT 43.5  MCV 94.6  PLT 365   Cardiac Enzymes:  Recent Labs Lab 07/08/13 0200  TROPONINI <0.30    CBG: No results found for this  basename: GLUCAP,  in the last 168 hours   Radiological Exams on Admission: Ct Abdomen Pelvis W Contrast  07/08/2013   CLINICAL DATA:  Abdominal pain, nausea, vomiting and diarrhea.  EXAM: CT ABDOMEN AND PELVIS WITH CONTRAST  TECHNIQUE: Multidetector CT imaging of the abdomen and pelvis was performed using the standard protocol following bolus administration of intravenous contrast.  CONTRAST:  80mL OMNIPAQUE IOHEXOL 300 MG/ML  SOLN  COMPARISON:  Abdominal radiograph performed earlier today at 1:40 a.m.  FINDINGS: The visualized lung bases are clear. Mild apparent distal esophageal wall thickening may reflect sequelae of gastroesophageal reflux.  There is diffuse fatty infiltration within the liver, with mild sparing about the gallbladder fossa. The spleen is unremarkable in appearance. The patient is status post cholecystectomy, with clips noted at the gallbladder fossa. The pancreas and adrenal glands are unremarkable.  The kidneys are unremarkable in appearance. There is no evidence of hydronephrosis. No renal or ureteral stones are seen. No perinephric stranding is appreciated.  No free fluid is identified. The small bowel is unremarkable in appearance. The stomach is within normal limits. No acute vascular abnormalities are seen. Minimal calcification is noted along the distal abdominal aorta and its branches.  The appendix is normal in caliber and contains air, without evidence for appendicitis. Diffuse fatty infiltration of the wall of the cecum and ascending colon likely reflects chronic sequelae of inflammation. The colon is otherwise unremarkable in appearance.  The bladder is mildly distended and grossly unremarkable in appearance. The patient is status post hysterectomy. No suspicious adnexal masses are seen. No inguinal lymphadenopathy is seen.  No acute osseous abnormalities are identified. There is mild grade 1 anterolisthesis of L3 on L4, reflecting underlying facet disease.  IMPRESSION: 1. No  definite focal abnormality seen to explain the patient's symptoms. 2. Diffuse fatty infiltration within the liver. 3. Apparent mild distal esophageal wall thickening may reflect sequelae of gastroesophageal reflux.   Electronically Signed   By: Roanna Raider M.D.   On: 07/08/2013 03:28   Dg Abd Acute W/chest  07/08/2013   CLINICAL DATA:  Upper abdominal pain, nausea and vomiting.  EXAM: ACUTE ABDOMEN SERIES (ABDOMEN 2 VIEW & CHEST 1 VIEW)  COMPARISON:  Chest dated 04/10/2011.  FINDINGS: Normal sized heart. Clear lungs. Azygos fissure.  Normal bowel gas pattern. No free peritoneal air. Cholecystectomy clips. Mild to moderate thoracolumbar scoliosis and degenerative changes. Bilateral pelvic phleboliths.  IMPRESSION: No acute abnormality.   Electronically Signed   By: Gordan Payment M.D.   On: 07/08/2013 01:58   Assessment/Plan Present on Admission:  . Irritable  bowel syndrome (IBS) . Volume depletion . Metabolic acidosis . Weakness . Fibromyalgia  PLAN:  I suspect she has IBS with diarrhea preponderance.  She has severe acidosis, with her shortness of breath likely a respiratory compensation.  Will admit her for IVF.  I will give her normal saline with bicarb.  No further work up is indicated at this time.  I suspect she will feel much better with hydration and correction of her metabolic acidosis.  She may indeed require bicarb tablets outpatient.  Please let Dr Rhea Belton know of her admission tomorrow.  Stress management may help with her diarrhea, and Lanetta Inch will be continued as it has been helpful according to her.  She is otherwise stable, full code, and will be admitted to [facility] service.  Thank you for allowing me to participate in her care.  Other plans as per orders.  Code Status: FULL Unk Lightning, MD. [facility] Pager [phone] 7pm to 7am.  07/08/2013, 3:57 AM

## 2013-07-08 NOTE — ED Notes (Signed)
Bed: ZO10 Expected date:  Expected time:  Means of arrival:  Comments: EMS/52 yo female with GI issues for 6 months

## 2013-07-08 NOTE — ED Provider Notes (Signed)
Medical screening examination/treatment/procedure(s) were conducted as a shared visit with non-physician practitioner(s) and myself.  I personally evaluated the patient during the encounter.  EKG Interpretation     Ventricular Rate:  97 PR Interval:  189 QRS Duration: 88 QT Interval:  372 QTC Calculation: 472 R Axis:   63 Text Interpretation:  Sinus rhythm Nonspecific ST and T wave abnormality Artifact Abnormal ECG           Chronic diarrhea presents with ABD pain, increased weakness and dyspnea. Followed by GI.  Recently under inc amount of stress. On exam tachypnea, mild upper ABD tenderness. Heart RRR. Lungs CTA.  Labs shows normal lactate metabolic acidosis.  IVFs, sodium bicarb. MED admit.     Results for orders placed during the hospital encounter of 07/08/13  CBC WITH DIFFERENTIAL      Result Value Range   WBC 9.4  4.0 - 10.5 K/uL   RBC 4.60  3.87 - 5.11 MIL/uL   Hemoglobin 14.7  12.0 - 15.0 g/dL   HCT 16.1  09.6 - 04.5 %   MCV 94.6  78.0 - 100.0 fL   MCH 32.0  26.0 - 34.0 pg   MCHC 33.8  30.0 - 36.0 g/dL   RDW 40.9  81.1 - 91.4 %   Platelets 365  150 - 400 K/uL   Neutrophils Relative % 83 (*) 43 - 77 %   Lymphocytes Relative 9 (*) 12 - 46 %   Monocytes Relative 8  3 - 12 %   Eosinophils Relative 0  0 - 5 %   Basophils Relative 0  0 - 1 %   Neutro Abs 7.8 (*) 1.7 - 7.7 K/uL   Lymphs Abs 0.8  0.7 - 4.0 K/uL   Monocytes Absolute 0.8  0.1 - 1.0 K/uL   Eosinophils Absolute 0.0  0.0 - 0.7 K/uL   Basophils Absolute 0.0  0.0 - 0.1 K/uL   RBC Morphology POLYCHROMASIA PRESENT    COMPREHENSIVE METABOLIC PANEL      Result Value Range   Sodium 135  135 - 145 mEq/L   Potassium 4.0  3.5 - 5.1 mEq/L   Chloride 94 (*) 96 - 112 mEq/L   CO2 8 (*) 19 - 32 mEq/L   Glucose, Bld 102 (*) 70 - 99 mg/dL   BUN 15  6 - 23 mg/dL   Creatinine, Ser 7.82 (*) 0.50 - 1.10 mg/dL   Calcium 9.8  8.4 - 95.6 mg/dL   Total Protein 8.2  6.0 - 8.3 g/dL   Albumin 4.3  3.5 - 5.2 g/dL   AST 35   0 - 37 U/L   ALT 17  0 - 35 U/L   Alkaline Phosphatase 113  39 - 117 U/L   Total Bilirubin 0.3  0.3 - 1.2 mg/dL   GFR calc non Af Amer 37 (*) >90 mL/min   GFR calc Af Amer 43 (*) >90 mL/min  LIPASE, BLOOD      Result Value Range   Lipase 43  11 - 59 U/L  URINALYSIS, ROUTINE W REFLEX MICROSCOPIC      Result Value Range   Color, Urine YELLOW  YELLOW   APPearance CLEAR  CLEAR   Specific Gravity, Urine 1.027  1.005 - 1.030   pH 5.5  5.0 - 8.0   Glucose, UA NEGATIVE  NEGATIVE mg/dL   Hgb urine dipstick TRACE (*) NEGATIVE   Bilirubin Urine LARGE (*) NEGATIVE   Ketones, ur >80 (*) NEGATIVE mg/dL  Protein, ur 100 (*) NEGATIVE mg/dL   Urobilinogen, UA 0.2  0.0 - 1.0 mg/dL   Nitrite NEGATIVE  NEGATIVE   Leukocytes, UA TRACE (*) NEGATIVE  BLOOD GAS, VENOUS      Result Value Range   FIO2 0.21     Delivery systems ROOM AIR     pH, Ven 7.112 (*) 7.250 - 7.300   pCO2, Ven 26.4 (*) 45.0 - 50.0 mmHg   pO2, Ven 34.9  30.0 - 45.0 mmHg   Bicarbonate 8.1 (*) 20.0 - 24.0 mEq/L   TCO2 7.7  0 - 100 mmol/L   Acid-base deficit 20.8 (*) 0.0 - 2.0 mmol/L   O2 Saturation 64.8     Patient temperature 98.6     Collection site VEIN     Drawn by COLLECTED BY LABORATORY     Sample type VENOUS    TROPONIN I      Result Value Range   Troponin I <0.30  <0.30 ng/mL  SALICYLATE LEVEL      Result Value Range   Salicylate Lvl <2.0 (*) 2.8 - 20.0 mg/dL  ETHANOL      Result Value Range   Alcohol, Ethyl (B) <11  0 - 11 mg/dL  URINE MICROSCOPIC-ADD ON      Result Value Range   Squamous Epithelial / LPF RARE  RARE   WBC, UA 0-2  <3 WBC/hpf   RBC / HPF 0-2  <3 RBC/hpf   Bacteria, UA RARE  RARE   Casts HYALINE CASTS (*) NEGATIVE  CG4 I-STAT (LACTIC ACID)      Result Value Range   Lactic Acid, Venous 1.20  0.5 - 2.2 mmol/L   Ct Abdomen Pelvis W Contrast  07/08/2013   CLINICAL DATA:  Abdominal pain, nausea, vomiting and diarrhea.  EXAM: CT ABDOMEN AND PELVIS WITH CONTRAST  TECHNIQUE: Multidetector CT  imaging of the abdomen and pelvis was performed using the standard protocol following bolus administration of intravenous contrast.  CONTRAST:  80mL OMNIPAQUE IOHEXOL 300 MG/ML  SOLN  COMPARISON:  Abdominal radiograph performed earlier today at 1:40 a.m.  FINDINGS: The visualized lung bases are clear. Mild apparent distal esophageal wall thickening may reflect sequelae of gastroesophageal reflux.  There is diffuse fatty infiltration within the liver, with mild sparing about the gallbladder fossa. The spleen is unremarkable in appearance. The patient is status post cholecystectomy, with clips noted at the gallbladder fossa. The pancreas and adrenal glands are unremarkable.  The kidneys are unremarkable in appearance. There is no evidence of hydronephrosis. No renal or ureteral stones are seen. No perinephric stranding is appreciated.  No free fluid is identified. The small bowel is unremarkable in appearance. The stomach is within normal limits. No acute vascular abnormalities are seen. Minimal calcification is noted along the distal abdominal aorta and its branches.  The appendix is normal in caliber and contains air, without evidence for appendicitis. Diffuse fatty infiltration of the wall of the cecum and ascending colon likely reflects chronic sequelae of inflammation. The colon is otherwise unremarkable in appearance.  The bladder is mildly distended and grossly unremarkable in appearance. The patient is status post hysterectomy. No suspicious adnexal masses are seen. No inguinal lymphadenopathy is seen.  No acute osseous abnormalities are identified. There is mild grade 1 anterolisthesis of L3 on L4, reflecting underlying facet disease.  IMPRESSION: 1. No definite focal abnormality seen to explain the patient's symptoms. 2. Diffuse fatty infiltration within the liver. 3. Apparent mild distal esophageal wall thickening may reflect sequelae  of gastroesophageal reflux.   Electronically Signed   By: Roanna Raider  M.D.   On: 07/08/2013 03:28   Dg Abd Acute W/chest  07/08/2013   CLINICAL DATA:  Upper abdominal pain, nausea and vomiting.  EXAM: ACUTE ABDOMEN SERIES (ABDOMEN 2 VIEW & CHEST 1 VIEW)  COMPARISON:  Chest dated 04/10/2011.  FINDINGS: Normal sized heart. Clear lungs. Azygos fissure.  Normal bowel gas pattern. No free peritoneal air. Cholecystectomy clips. Mild to moderate thoracolumbar scoliosis and degenerative changes. Bilateral pelvic phleboliths.  IMPRESSION: No acute abnormality.   Electronically Signed   By: Gordan Payment M.D.   On: 07/08/2013 01:58      Sunnie Nielsen, MD 07/08/13 [phone]

## 2013-07-08 NOTE — ED Notes (Signed)
Pt's friend, Massie Bougie, wants to be notified on pt's discharge for transportation:  Tel# [phone]

## 2013-07-08 NOTE — ED Notes (Addendum)
Brought in by EMS from home with c/o abdominal pain with N/V/D. Per pt, she has been having these problems "for months" but it just got its "worst" tonight and she feels "feverish";states she has been having "almost constant diarrhea" that makes her weak.

## 2013-07-08 NOTE — Progress Notes (Signed)
Called by toxicology lab who suggested screening for acute iron toxicity, rhabdo, heavy metal toxicity   Added recommended labs

## 2013-07-08 NOTE — ED Provider Notes (Signed)
CSN: [id number]     Arrival date & time 07/08/13  0006 History   First MD Initiated Contact with Patient 07/08/13 0050     Chief Complaint  Patient presents with  . Abdominal Pain   (Consider location/radiation/quality/duration/timing/severity/associated sxs/prior Treatment) HPI Comments: Patient is a 52 y/o female with a hx of IBS and fibromyalgia who presents for worsening abdominal pain with associated nausea and emesis/blenching x 3-4 days. Patient states that symptoms have been preceded by watery, nonbloody diarrhea which has been ongoing for 6 months. Patient has seen Dr. Rhea Belton for this with a colonoscopy on 06/23/13 significant only for polyps. Patient states that she recently began with abdominal pain that has worsened compared to onset that is nonradiating and present mostly in her L epigastrium/medial LUQ. Patient states her emesis has been nonbloody and without alleviating factors. She has been taking cholestyramine for diarrhea which tends to make her more constipated; however patient does endorse bowel movement within the past 24 hours. Symptoms associated with generalized myalgias, anorexia, shortness of breath and subjective fever. Patient denies chest pain, hematemesis, melena, hematochezia, urinary symptoms, vaginal complaints, and numbness/tingling. Surgical abd hx significant for cholecystectomy, hernia repair, and abdominal hysterectomy.  Patient is a 52 y.o. female presenting with abdominal pain. The history is provided by the patient. No language interpreter was used.  Abdominal Pain Associated symptoms: chills, diarrhea, fatigue, fever (subjective), nausea and vomiting   Associated symptoms: no chest pain, no dysuria, no hematuria and no shortness of breath     Past Medical History  Diagnosis Date  . Migraine   . Anxiety and depression   . Fibromyalgia   . Family history of anesthesia complication     mom was slow in awakening  . Asthma   . Sleep apnea     uses cpap   . Anxiety   . Palpitations     assoc w anxiety  . IBS (irritable bowel syndrome)   . GERD (gastroesophageal reflux disease)   . Shortness of breath     w/ activities and sometimes when lying flat  . Hyperlipidemia    Past Surgical History  Procedure Laterality Date  . Ectopic pregnancy surgery    . Elbow surgery    . Vaginal hysterectomy    . Hernia repair    . Cholecystectomy    . Knee surgery    . Knee arthroscopy with lateral menisectomy Right 02/20/2013    Procedure: RIGHT KNEE ARTHROSCOPY WITH MEDIAL AND LATERAL MENISECTOMY, Abrasion Chondroplasty of medial and lateral femoral condyle, microfracture of medial and lateral condyle, synovectomy of supra patella pouch;  Surgeon: Jacki Cones, MD;  Location: [facility];  Service: Orthopedics;  Laterality: Right;   Family History  Problem Relation Age of Onset  . Multiple sclerosis Sister   . Depression    . Diabetes    . Heart attack    . Breast cancer Paternal Grandmother   . Colon cancer Neg Hx   . Esophageal cancer Neg Hx   . Stomach cancer Neg Hx    History  Substance Use Topics  . Smoking status: Never Smoker   . Smokeless tobacco: Never Used  . Alcohol Use: Yes     Comment: occasionally   OB History   Grav Para Term Preterm Abortions TAB SAB Ect Mult Living                 Review of Systems  Constitutional: Positive for fever (subjective), chills and fatigue.  Respiratory:  Negative for shortness of breath.   Cardiovascular: Negative for chest pain.  Gastrointestinal: Positive for nausea, vomiting, abdominal pain and diarrhea. Negative for blood in stool.  Genitourinary: Negative for dysuria and hematuria.  Musculoskeletal: Positive for myalgias.  Neurological: Negative for syncope and numbness.  All other systems reviewed and are negative.    Allergies  Aspirin; Lyrica; and Benzocaine  Home Medications   Current Outpatient Rx  Name  Route  Sig  Dispense  Refill  . acetaminophen  (TYLENOL) 500 MG tablet   Oral   Take 500 mg by mouth every 6 (six) hours as needed for pain.         . cholestyramine (QUESTRAN) 4 G packet      Take one packet daily before a meal and take at least  2 hours before other medications to avoid decreased absorption . If one packet does not help with diarrhea, may increase to twice daily, DO NOT TAKE ANTI DIARRHEALS LIKE IMODIUM OR LOMOTIL WHILE TAKING CHOLESTYRAMINE.   60 each   12   . clonazePAM (KLONOPIN) 0.5 MG tablet   Oral   Take by mouth 3 (three) times daily.         . CRESTOR 5 MG tablet      1 day or 1 dose.         . hydrOXYzine (VISTARIL) 25 MG capsule   Oral   Take 1 capsule by mouth daily.         Marland Kitchen ipratropium (ATROVENT HFA) 17 MCG/ACT inhaler   Inhalation   Inhale 2 puffs into the lungs every 6 (six) hours.         . SUMAtriptan (IMITREX) 25 MG tablet   Oral   Take 1 tablet by mouth daily.         . temazepam (RESTORIL) 15 MG capsule      1 day or 1 dose.         . traZODone (DESYREL) 50 MG tablet   Oral   Take 1 tablet by mouth daily.         Marland Kitchen venlafaxine XR (EFFEXOR-XR) 75 MG 24 hr capsule   Oral   Take 1 capsule by mouth daily.         . Vitamin D, Ergocalciferol, (DRISDOL) 50000 UNITS CAPS   Oral   Take 50,000 Units by mouth.          BP 123/81  Pulse 100  Temp(Src) 98.5 F (36.9 C) (Oral)  Resp 18  SpO2 100%  Physical Exam  Nursing note and vitals reviewed. Constitutional: She is oriented to person, place, and time. She appears well-developed and well-nourished. No distress.  Uncomfortable, but nontoxic appearing. Patient with intermittent belching.  HENT:  Head: Normocephalic and atraumatic.  Mouth/Throat: Oropharynx is clear and moist. No oropharyngeal exudate.  Eyes: Conjunctivae and EOM are normal. Pupils are equal, round, and reactive to light. No scleral icterus.  Neck: Normal range of motion.  Cardiovascular: Normal rate, regular rhythm and normal heart  sounds.   Pulmonary/Chest: Effort normal and breath sounds normal. No respiratory distress. She has no wheezes. She has no rales.  No retractions or accessory muscle use. Symmetric chest expansion.  Abdominal: Soft. There is tenderness (L epigastric/LUQ). There is no rebound and no guarding.  No peritoneal signs or evidence of acute surgical abdomen.  Musculoskeletal: Normal range of motion.  Neurological: She is alert and oriented to person, place, and time.  Skin: Skin is warm and dry. No  rash noted. She is not diaphoretic. No erythema. No pallor.  Psychiatric: She has a normal mood and affect. Her behavior is normal.    ED Course  Procedures (including critical care time) Labs Review Labs Reviewed  CBC WITH DIFFERENTIAL - Abnormal; Notable for the following:    Neutrophils Relative % 83 (*)    Lymphocytes Relative 9 (*)    Neutro Abs 7.8 (*)    All other components within normal limits  COMPREHENSIVE METABOLIC PANEL - Abnormal; Notable for the following:    Chloride 94 (*)    CO2 8 (*)    Glucose, Bld 102 (*)    Creatinine, Ser 1.57 (*)    GFR calc non Af Amer 37 (*)    GFR calc Af Amer 43 (*)    All other components within normal limits  URINALYSIS, ROUTINE W REFLEX MICROSCOPIC - Abnormal; Notable for the following:    Hgb urine dipstick TRACE (*)    Bilirubin Urine LARGE (*)    Ketones, ur >80 (*)    Protein, ur 100 (*)    Leukocytes, UA TRACE (*)    All other components within normal limits  BLOOD GAS, VENOUS - Abnormal; Notable for the following:    pH, Ven 7.112 (*)    pCO2, Ven 26.4 (*)    Bicarbonate 8.1 (*)    Acid-base deficit 20.8 (*)    All other components within normal limits  SALICYLATE LEVEL - Abnormal; Notable for the following:    Salicylate Lvl <2.0 (*)    All other components within normal limits  URINE MICROSCOPIC-ADD ON - Abnormal; Notable for the following:    Casts HYALINE CASTS (*)    All other components within normal limits  URINE CULTURE   LIPASE, BLOOD  TROPONIN I  ETHANOL  CG4 I-STAT (LACTIC ACID)   Imaging Review Ct Abdomen Pelvis W Contrast  07/08/2013   CLINICAL DATA:  Abdominal pain, nausea, vomiting and diarrhea.  EXAM: CT ABDOMEN AND PELVIS WITH CONTRAST  TECHNIQUE: Multidetector CT imaging of the abdomen and pelvis was performed using the standard protocol following bolus administration of intravenous contrast.  CONTRAST:  80mL OMNIPAQUE IOHEXOL 300 MG/ML  SOLN  COMPARISON:  Abdominal radiograph performed earlier today at 1:40 a.m.  FINDINGS: The visualized lung bases are clear. Mild apparent distal esophageal wall thickening may reflect sequelae of gastroesophageal reflux.  There is diffuse fatty infiltration within the liver, with mild sparing about the gallbladder fossa. The spleen is unremarkable in appearance. The patient is status post cholecystectomy, with clips noted at the gallbladder fossa. The pancreas and adrenal glands are unremarkable.  The kidneys are unremarkable in appearance. There is no evidence of hydronephrosis. No renal or ureteral stones are seen. No perinephric stranding is appreciated.  No free fluid is identified. The small bowel is unremarkable in appearance. The stomach is within normal limits. No acute vascular abnormalities are seen. Minimal calcification is noted along the distal abdominal aorta and its branches.  The appendix is normal in caliber and contains air, without evidence for appendicitis. Diffuse fatty infiltration of the wall of the cecum and ascending colon likely reflects chronic sequelae of inflammation. The colon is otherwise unremarkable in appearance.  The bladder is mildly distended and grossly unremarkable in appearance. The patient is status post hysterectomy. No suspicious adnexal masses are seen. No inguinal lymphadenopathy is seen.  No acute osseous abnormalities are identified. There is mild grade 1 anterolisthesis of L3 on L4, reflecting underlying facet disease.  IMPRESSION:  1. No definite focal abnormality seen to explain the patient's symptoms. 2. Diffuse fatty infiltration within the liver. 3. Apparent mild distal esophageal wall thickening may reflect sequelae of gastroesophageal reflux.   Electronically Signed   By: Roanna Raider M.D.   On: 07/08/2013 03:28   Dg Abd Acute W/chest  07/08/2013   CLINICAL DATA:  Upper abdominal pain, nausea and vomiting.  EXAM: ACUTE ABDOMEN SERIES (ABDOMEN 2 VIEW & CHEST 1 VIEW)  COMPARISON:  Chest dated 04/10/2011.  FINDINGS: Normal sized heart. Clear lungs. Azygos fissure.  Normal bowel gas pattern. No free peritoneal air. Cholecystectomy clips. Mild to moderate thoracolumbar scoliosis and degenerative changes. Bilateral pelvic phleboliths.  IMPRESSION: No acute abnormality.   Electronically Signed   By: Gordan Payment M.D.   On: 07/08/2013 01:58    EKG Interpretation     Ventricular Rate:  97 PR Interval:  189 QRS Duration: 88 QT Interval:  372 QTC Calculation: 472 R Axis:   63 Text Interpretation:  Sinus rhythm Nonspecific ST and T wave abnormality Artifact Abnormal ECG            MDM   1. Abdominal pain    Patient presenting for worsening abdominal pain with nausea and persistent NB/NB emesis and belching. Patient states symptoms preceded by diarrhea x 6 months. Hx of unremarkable colonoscopy by Dr. Rhea Belton on 06/23/13. Patient endorsing recent BM yesterday. She exhibits focal TTP in L epigastrium/medial LUQ without peritoneal signs or involuntary guarding. No distention or rebound. Labs without leukocytosis or marked electrolyte imbalance; however, patient does have anion gap acidosis with gap of 33 and pH of 7.11. CO2 level critically low at 8, confirmed on VBG. Lactic acid normal at 1.2. CT abd/pelvis ordered for further work up. Patient being tx in ED with IVF, pepcid, zofran, and reglan. Decadron ordered for headache. Will continue to monitor.  Patient without any acute findings on CT scan; suspect IBS as cause  of worsening abdominal pain and N/V/D. Given worsening of symptoms, persistent nausea/emesis, and anion gap acidosis, will admit to hospitalist for further tx of symptoms, fluid hydration, and stabilization of chemistries. Dr. Conley Rolls to admit.  Antony Madura, PA-C 07/08/13 [id number]

## 2013-07-08 NOTE — Progress Notes (Signed)
[facility] PROGRESS NOTE  [name] ZOX:[id number] DOB: 10-22-1960 DOA: 07/08/2013 PCP: Warrick Parisian, MD  Assessment/Plan  + Anion gap metabolic acidosis with pH 7.11, bicarb of 8, normal glucose.  UA positive for ketones.  Lactic acid negative.  Patient stated today that she has been constipated for the last two weeks and denies recent diarrhea.  She has been having severe nausea and vomiting instead.  Given her description of her symptoms, she would likely have low Na, K, Cl, and metabolic alkalosis from loss of gastric acid.  Consider alternative diagnoses.   -  Salicylate level neg -  EtOH < 10 -  Check serum osms -  Serum methanol, ethylene glycol, and isopropyl alcohol levels -  Tylenol level -  Repeat BMP this afternoon to check gap -  Continue IVF  Severe IBS now with constipation and vomiting -  Agree with holding cholestyramine -  Start IV PPI -  GI consultation   AKI, possibly secondary to volume depletion or toxic injury -  Continue IVF and repeat BMP  Depression/anxiety, currently wan.  Barely lifts up her hand when I hold up my own to shake hers.  She barely touched my hand to shake and mostly kept her eyes closed during my interview.  Used a very soft voice throughout interview. -  Continue effexor but consider increasing dose   Fibromyalgia, stable.  Continue CPAP and wellbutrin  Migraine headache.  Did not respond to decadron.  Give sumatriptan.  Try chlorpromazine if triptan not working  OSA, stable.  Continue CPAP   Diet:  Regular Access:  PIV IVF:  yes Proph:  heparin  Code Status: full Family Communication: patient alone Disposition Plan: pending further evaluation for cause of metabolic acidosis   Consultants:  GI, Dr. Rhea Belton  Procedures:  none  Antibiotics:  none   HPI/Subjective:  C/o migraine headache, nausea without vomiting, no recent diarrhea.      Objective: Filed Vitals:   07/08/13 0210 07/08/13 0415 07/08/13  0700 07/08/13 1427  BP: 123/81   144/89  Pulse: 100 88  84  Temp:    98 F (36.7 C)  TempSrc:    Oral  Resp:  14  18  Height:   5\' 5"  (1.651 m)   Weight:   96.5 kg (212 lb 11.9 oz)   SpO2: 100% 100%  100%    Intake/Output Summary (Last 24 hours) at 07/08/13 1522 Last data filed at 07/08/13 1432  Gross per 24 hour  Intake   2240 ml  Output    700 ml  Net   1540 ml   Filed Weights   07/08/13 0700  Weight: 96.5 kg (212 lb 11.9 oz)    Exam:   General:  CF, No acute distress  HEENT:  NCAT, MMM  Cardiovascular:  RRR, nl S1, S2 no mrg, 2+ pulses, warm extremities  Respiratory:  CTAB, no increased WOB  Abdomen:   NABS, soft, NT/ND  MSK:   Decreased tone and bulk, no LEE  Neuro:  Grossly intact  Data Reviewed: Basic Metabolic Panel:  Recent Labs Lab 07/08/13 0030  NA 135  K 4.0  CL 94*  CO2 8*  GLUCOSE 102*  BUN 15  CREATININE 1.57*  CALCIUM 9.8   Liver Function Tests:  Recent Labs Lab 07/08/13 0030  AST 35  ALT 17  ALKPHOS 113  BILITOT 0.3  PROT 8.2  ALBUMIN 4.3    Recent Labs Lab 07/08/13 0030  LIPASE 43   No results  found for this basename: AMMONIA,  in the last 168 hours CBC:  Recent Labs Lab 07/08/13 0030  WBC 9.4  NEUTROABS 7.8*  HGB 14.7  HCT 43.5  MCV 94.6  PLT 365   Cardiac Enzymes:  Recent Labs Lab 07/08/13 0200  TROPONINI <0.30   BNP (last 3 results) No results found for this basename: PROBNP,  in the last 8760 hours CBG: No results found for this basename: GLUCAP,  in the last 168 hours  No results found for this or any previous visit (from the past 240 hour(s)).   Studies: Ct Abdomen Pelvis W Contrast  07/08/2013   CLINICAL DATA:  Abdominal pain, nausea, vomiting and diarrhea.  EXAM: CT ABDOMEN AND PELVIS WITH CONTRAST  TECHNIQUE: Multidetector CT imaging of the abdomen and pelvis was performed using the standard protocol following bolus administration of intravenous contrast.  CONTRAST:  80mL OMNIPAQUE  IOHEXOL 300 MG/ML  SOLN  COMPARISON:  Abdominal radiograph performed earlier today at 1:40 a.m.  FINDINGS: The visualized lung bases are clear. Mild apparent distal esophageal wall thickening may reflect sequelae of gastroesophageal reflux.  There is diffuse fatty infiltration within the liver, with mild sparing about the gallbladder fossa. The spleen is unremarkable in appearance. The patient is status post cholecystectomy, with clips noted at the gallbladder fossa. The pancreas and adrenal glands are unremarkable.  The kidneys are unremarkable in appearance. There is no evidence of hydronephrosis. No renal or ureteral stones are seen. No perinephric stranding is appreciated.  No free fluid is identified. The small bowel is unremarkable in appearance. The stomach is within normal limits. No acute vascular abnormalities are seen. Minimal calcification is noted along the distal abdominal aorta and its branches.  The appendix is normal in caliber and contains air, without evidence for appendicitis. Diffuse fatty infiltration of the wall of the cecum and ascending colon likely reflects chronic sequelae of inflammation. The colon is otherwise unremarkable in appearance.  The bladder is mildly distended and grossly unremarkable in appearance. The patient is status post hysterectomy. No suspicious adnexal masses are seen. No inguinal lymphadenopathy is seen.  No acute osseous abnormalities are identified. There is mild grade 1 anterolisthesis of L3 on L4, reflecting underlying facet disease.  IMPRESSION: 1. No definite focal abnormality seen to explain the patient's symptoms. 2. Diffuse fatty infiltration within the liver. 3. Apparent mild distal esophageal wall thickening may reflect sequelae of gastroesophageal reflux.   Electronically Signed   By: Roanna Raider M.D.   On: 07/08/2013 03:28   Dg Abd Acute W/chest  07/08/2013   CLINICAL DATA:  Upper abdominal pain, nausea and vomiting.  EXAM: ACUTE ABDOMEN SERIES  (ABDOMEN 2 VIEW & CHEST 1 VIEW)  COMPARISON:  Chest dated 04/10/2011.  FINDINGS: Normal sized heart. Clear lungs. Azygos fissure.  Normal bowel gas pattern. No free peritoneal air. Cholecystectomy clips. Mild to moderate thoracolumbar scoliosis and degenerative changes. Bilateral pelvic phleboliths.  IMPRESSION: No acute abnormality.   Electronically Signed   By: Gordan Payment M.D.   On: 07/08/2013 01:58    Scheduled Meds: . atorvastatin  10 mg Oral q1800  . cholestyramine  4 g Oral TID  . pantoprazole  40 mg Oral BID AC  . traZODone  50 mg Oral QHS  . venlafaxine XR  75 mg Oral Daily   Continuous Infusions: . dextrose 5 % and 0.45% NaCl 1,000 mL with sodium bicarbonate 100 mEq infusion 125 mL/hr at 07/08/13 1459    Principal Problem:   Diarrhea with  dehydration Active Problems:   Weakness   Fibromyalgia   Irritable bowel syndrome (IBS)   Volume depletion   Metabolic acidosis    Time spent: 30 min    Tye Vigo, [facility]  [facility] Pager [phone]. If 7PM-7AM, please contact night-coverage at [email or web address], password [facility] 07/08/2013, 3:22 PM  LOS: 0 days

## 2013-07-08 NOTE — Consult Note (Addendum)
Consultation  Referring Provider:  [facility]    Primary Care Physician:  Warrick Parisian, MD Primary Gastroenterologist:   Erick Blinks, MD      Reason for Consultation:              HPI:   [name] is a 52 y.o. female who recently established care with our group. She has a history of GERD, adenomatous colon polyps and chronic IBS with alternating constipation and diarrhea but for the 6 months prior to seeing [place] patient had bee having several episodes of diarrhea a day. She described pain below the umbilicus and occasional  Nausea and vomiting. Office labs including ESR, TSH, tTG, IgA were normal. Stool for o&p, culture and c-diff were negative. Stool lactoferrin was negative.  Pancreatic elastase was normal. Colonoscopy with terminal ileum evaluation and random biopsies ate last month was unrevealing except for polyps. Random biopsies were normal. Polyps were adenomatous. Patient has had a cholecystectomy. We started her on Questran.   Patient admitted early this am with vomiting, abdominal pain as well as pain all over her body.  She had sharp pains in her arms, legs, and back. She had vomited several times over the last 1-2 days. Now just belching and having dry heaves.  Labs c/w severe metabolic acidosis. CTscan with contrast unrevealing. Some esophageal wall thickening was seen. She hasn't been having diarrhea since starting Questran last Thursday. In fact, her last BM was 2-3 days ago.  No fever. No recent travel. No medication changes other than addition of Questran recently.   Patient describes chronic nausea and vomiting ( ? Years) but over the last 6 months she has vomited 2-3 times a day. Just prior to admission vomiting escalated significantly. Interestingly she hasn't had any significant weight loss. Weight fluctuates 210-218 pounds.   Past Medical History  Diagnosis Date  . Migraine   . Anxiety and depression   . Fibromyalgia   . Family history of anesthesia  complication     mom was slow in awakening  . Asthma   . Sleep apnea     uses cpap  . Palpitations     assoc w anxiety  . IBS (irritable bowel syndrome)   . GERD (gastroesophageal reflux disease)     w/ activities and sometimes when lying flat  . Hyperlipidemia     Past Surgical History  Procedure Laterality Date  . Ectopic pregnancy surgery    . Elbow surgery    . Vaginal hysterectomy    . Hernia repair    . Cholecystectomy    . Knee surgery    . Knee arthroscopy with lateral menisectomy Right 02/20/2013    Procedure: RIGHT KNEE ARTHROSCOPY WITH MEDIAL AND LATERAL MENISECTOMY, Abrasion Chondroplasty of medial and lateral femoral condyle, microfracture of medial and lateral condyle, synovectomy of supra patella pouch;  Surgeon: Jacki Cones, MD;  Location: [facility];  Service: Orthopedics;  Laterality: Right;    Family History  Problem Relation Age of Onset  . Multiple sclerosis Sister   . Depression    . Diabetes    . Heart attack    . Breast cancer Paternal Grandmother   . Colon cancer Neg Hx   . Esophageal cancer Neg Hx   . Stomach cancer Neg Hx      History  Substance Use Topics  . Smoking status: Never Smoker   . Smokeless tobacco: Never Used  . Alcohol Use: Yes     Comment: occasionally  Prior to Admission medications   Medication Sig Start Date End Date Taking? Authorizing Provider  acetaminophen (TYLENOL) 500 MG tablet Take 500 mg by mouth every 6 (six) hours as needed for pain.   Yes Historical Provider, MD  cholestyramine Lanetta Inch) 4 G packet Take one packet daily before a meal and take at least  2 hours before other medications to avoid decreased absorption . If one packet does not help with diarrhea, may increase to twice daily, DO NOT TAKE ANTI DIARRHEALS LIKE IMODIUM OR LOMOTIL WHILE TAKING CHOLESTYRAMINE. 07/01/13  Yes Beverley Fiedler, MD  clonazePAM (KLONOPIN) 0.5 MG tablet Take by mouth 3 (three) times daily. 06/08/13  Yes  Historical Provider, MD  CRESTOR 5 MG tablet 1 day or 1 dose. 06/07/13  Yes Historical Provider, MD  hydrOXYzine (VISTARIL) 25 MG capsule Take 1 capsule by mouth daily. 01/22/13  Yes Historical Provider, MD  ipratropium (ATROVENT HFA) 17 MCG/ACT inhaler Inhale 2 puffs into the lungs every 6 (six) hours.   Yes Historical Provider, MD  SUMAtriptan (IMITREX) 25 MG tablet Take 1 tablet by mouth daily. 01/22/13  Yes Historical Provider, MD  temazepam (RESTORIL) 15 MG capsule 1 day or 1 dose. 04/21/13  Yes Historical Provider, MD  traZODone (DESYREL) 50 MG tablet Take 1 tablet by mouth daily. 10/22/12  Yes Historical Provider, MD  venlafaxine XR (EFFEXOR-XR) 75 MG 24 hr capsule Take 1 capsule by mouth daily. 01/22/13  Yes Historical Provider, MD  Vitamin D, Ergocalciferol, (DRISDOL) 50000 UNITS CAPS Take 50,000 Units by mouth.   Yes Historical Provider, MD    Current Facility-Administered Medications  Medication Dose Route Frequency Provider Last Rate Last Dose  . atorvastatin (LIPITOR) tablet 10 mg  10 mg Oral q1800 Houston Siren, MD      . cholestyramine Lanetta Inch) packet 4 g  4 g Oral TID Houston Siren, MD   4 g at 07/08/13 0946  . clonazePAM (KLONOPIN) tablet 0.5 mg  0.5 mg Oral TID PRN Houston Siren, MD      . dextrose 5 % and 0.45% NaCl 1,000 mL with sodium bicarbonate 100 mEq infusion   Intravenous Continuous Houston Siren, MD 125 mL/hr at 07/08/13 0530    . loperamide (IMODIUM) capsule 2 mg  2 mg Oral PRN Renae Fickle, MD      . ondansetron [facility]) injection 4 mg  4 mg Intravenous Q6H PRN Leda Gauze, NP   4 mg at 07/08/13 0557  . pantoprazole (PROTONIX) EC tablet 40 mg  40 mg Oral BID AC Renae Fickle, MD      . SUMAtriptan (IMITREX) tablet 25 mg  25 mg Oral Q2H PRN Renae Fickle, MD   25 mg at 07/08/13 0946  . temazepam (RESTORIL) capsule 15 mg  15 mg Oral QHS PRN Houston Siren, MD      . traZODone (DESYREL) tablet 50 mg  50 mg Oral QHS Houston Siren, MD      . venlafaxine XR (EFFEXOR-XR) 24 hr capsule 75  mg  75 mg Oral Daily Houston Siren, MD   75 mg at 07/08/13 0946    Allergies as of 07/08/2013 - Review Complete 07/08/2013  Allergen Reaction Noted  . Aspirin Swelling 01/29/2013  . Lyrica [pregabalin] Other (See Comments) 01/29/2013  . Benzocaine Rash 01/29/2013    Review of Systems:    Positive for weakness. All other systems reviewed and negative except where noted in HPI.   Physical Exam:  Vital signs in last 24 hours: Temp:  [98.5 F (  36.9 C)] 98.5 F (36.9 C) (11/11 0022) Pulse Rate:  [88-106] 88 (11/11 0415) Resp:  [14-18] 14 (11/11 0415) BP: (123-141)/(81-95) 123/81 mmHg (11/11 0210) SpO2:  [99 %-100 %] 100 % (11/11 0415) Weight:  [212 lb 11.9 oz (96.5 kg)] 212 lb 11.9 oz (96.5 kg) (11/11 0700) Last BM Date: 07/06/13 General:   Pleasant obese white female in NAD Head:  Normocephalic and atraumatic. Eyes:   No icterus.   Conjunctiva pink. Ears:  Normal auditory acuity. Neck:  Supple; no masses felt Lungs:  Respirations even and unlabored. Lungs clear to auscultation bilaterally.   No wheezes, crackles, or rhonchi.  Heart:  Regular rate and rhythm. Abdomen:  Soft, nondistended, nontender. Normal bowel sounds. No appreciable masses or hepatomegaly.  Msk:  Symmetrical without gross deformities.  Extremities:  Without edema. Neurologic:  Alert and  oriented x4;  grossly normal neurologically. Skin:  Intact without significant lesions or rashes. Cervical Nodes:  No significant cervical adenopathy. Psych:  Alert and cooperative. Flat affect.  LAB RESULTS:  Recent Labs  07/08/13 0030  WBC 9.4  HGB 14.7  HCT 43.5  PLT 365   BMET  Recent Labs  07/08/13 0030  NA 135  K 4.0  CL 94*  CO2 8*  GLUCOSE 102*  BUN 15  CREATININE 1.57*  CALCIUM 9.8   LFT  Recent Labs  07/08/13 0030  PROT 8.2  ALBUMIN 4.3  AST 35  ALT 17  ALKPHOS 113  BILITOT 0.3    STUDIES: Ct Abdomen Pelvis W Contrast  07/08/2013   CLINICAL DATA:  Abdominal pain, nausea, vomiting and  diarrhea.  EXAM: CT ABDOMEN AND PELVIS WITH CONTRAST  TECHNIQUE: Multidetector CT imaging of the abdomen and pelvis was performed using the standard protocol following bolus administration of intravenous contrast.  CONTRAST:  80mL OMNIPAQUE IOHEXOL 300 MG/ML  SOLN  COMPARISON:  Abdominal radiograph performed earlier today at 1:40 a.m.  FINDINGS: The visualized lung bases are clear. Mild apparent distal esophageal wall thickening may reflect sequelae of gastroesophageal reflux.  There is diffuse fatty infiltration within the liver, with mild sparing about the gallbladder fossa. The spleen is unremarkable in appearance. The patient is status post cholecystectomy, with clips noted at the gallbladder fossa. The pancreas and adrenal glands are unremarkable.  The kidneys are unremarkable in appearance. There is no evidence of hydronephrosis. No renal or ureteral stones are seen. No perinephric stranding is appreciated.  No free fluid is identified. The small bowel is unremarkable in appearance. The stomach is within normal limits. No acute vascular abnormalities are seen. Minimal calcification is noted along the distal abdominal aorta and its branches.  The appendix is normal in caliber and contains air, without evidence for appendicitis. Diffuse fatty infiltration of the wall of the cecum and ascending colon likely reflects chronic sequelae of inflammation. The colon is otherwise unremarkable in appearance.  The bladder is mildly distended and grossly unremarkable in appearance. The patient is status post hysterectomy. No suspicious adnexal masses are seen. No inguinal lymphadenopathy is seen.  No acute osseous abnormalities are identified. There is mild grade 1 anterolisthesis of L3 on L4, reflecting underlying facet disease.  IMPRESSION: 1. No definite focal abnormality seen to explain the patient's symptoms. 2. Diffuse fatty infiltration within the liver. 3. Apparent mild distal esophageal wall thickening may reflect  sequelae of gastroesophageal reflux.   Electronically Signed   By: Roanna Raider M.D.   On: 07/08/2013 03:28   Dg Abd Acute W/chest  07/08/2013  CLINICAL DATA:  Upper abdominal pain, nausea and vomiting.  EXAM: ACUTE ABDOMEN SERIES (ABDOMEN 2 VIEW & CHEST 1 VIEW)  COMPARISON:  Chest dated 04/10/2011.  FINDINGS: Normal sized heart. Clear lungs. Azygos fissure.  Normal bowel gas pattern. No free peritoneal air. Cholecystectomy clips. Mild to moderate thoracolumbar scoliosis and degenerative changes. Bilateral pelvic phleboliths.  IMPRESSION: No acute abnormality.   Electronically Signed   By: Gordan Payment M.D.   On: 07/08/2013 01:58    PREVIOUS ENDOSCOPIES:            Colonoscopy 06/23/13 for history of adenomatous polyps, evaluation of diarrhea and abdominal pain ([place]).   ENDOSCOPIC IMPRESSION:  1. Normal mucosa in the terminal ileum  2. Three sessile polyps measuring 4-8 mm in size were found at the  hepatic flexure and in the proximal transverse colon; Polypectomy  was performed using cold snare and using hot snare  3. Six sessile polyps ranging between 3-63mm in size were found in  the descending colon, sigmoid colon, and rectum; Polypectomy was  performed with cold forceps and using cold snare  4. The colon mucosa was otherwise normal; biopsies to exclude  microscopic colitis    Impression / Plan:   77. 53 year old female with longstanding IBS consisting of alternating constipation and diarrhea associated with crampy abdominal pain. Over last 6 months she has shifted to diarrhea. Extensive workup, including colonoscopy with random biopsies has been negative. She recently started daily Questran and diarrhea has stopped. Prior to admission we advised decreasing dose to QOD which might be a good plan upon discharge. For now, since she hasn't had a BM since Saturday will hold the Questran.   2. Chronic nausea and vomiting ( possibly for years) but daily over last 6 months and numerous times  in the hours preceding this admission. Suspect there is a functional component to her chronic nausea and vomiting. I do wonder if she has underlying gastroparesis. Not sure why the acute severe nausea and vomiting . No evidence for bowel obstruction. Doubt, but cannot exclude PUD.  Continue supportive care. If not improving, consider EGD. Outpatient gastric emptying study will be considered.    3. AKI. Creatinine 1.57, probably secondary to volume depletion.   4. Metabolic acidosis. Getting bicarbonate in IV fluids. Hospitalist managing  5. Depression, anxiety  6. Fibromyalgia  Thanks   LOS: 0 days   Willette Cluster  07/08/2013, 1:34 PM    [place] [facility] Attending  I have also seen and assessed the patient and agree with the above note.  I got a hx of nausea when I am upright and vertigo No nystagmus Will add meclizine to see what that does. Will f/u tomorrow. Agree w/ Dr. Malachi Bonds - the gap acidosis is unusual and raises other ?'s  Iva Boop, MD, [facility] [phone] (pager) 07/08/2013 4:13 PM

## 2013-07-09 DIAGNOSIS — IMO0001 Reserved for inherently not codable concepts without codable children: Secondary | ICD-10-CM

## 2013-07-09 LAB — GLUCOSE, CAPILLARY

## 2013-07-09 LAB — BASIC METABOLIC PANEL
BUN: 12 mg/dL (ref 6–23)
Calcium: 8.9 mg/dL (ref 8.4–10.5)
Chloride: 103 mEq/L (ref 96–112)
Creatinine, Ser: 0.96 mg/dL (ref 0.50–1.10)
GFR calc Af Amer: 77 mL/min — ABNORMAL LOW (ref >90)
GFR calc non Af Amer: 67 mL/min — ABNORMAL LOW (ref >90)
Sodium: 137 mEq/L (ref 135–145)

## 2013-07-09 LAB — URINE DRUGS OF ABUSE SCREEN W ALC, ROUTINE (REF LAB)
Amphetamine Screen, Ur: NEGATIVE
Barbiturate Quant, Ur: NEGATIVE
Cocaine Metabolites: NEGATIVE
Methadone: NEGATIVE
Phencyclidine (PCP): NEGATIVE
Propoxyphene: NEGATIVE

## 2013-07-09 LAB — OSMOLALITY: Osmolality: 305 mOsm/kg — ABNORMAL HIGH (ref 275–300)

## 2013-07-09 LAB — IRON AND TIBC: Iron: 107 ug/dL (ref 42–135)

## 2013-07-09 LAB — ALCOHOL, METHYL (METHANOL), BLOOD: Methanol Lvl: NEGATIVE

## 2013-07-09 LAB — ALCOHOL,  ISOPROPYL (ISOPROPANOL)
Acetone: 0.04
Isopropanol: NEGATIVE

## 2013-07-09 MED ORDER — SODIUM BICARBONATE 8.4 % IV SOLN
INTRAVENOUS | Status: DC
  Administered 2013-07-09: 08:00:00 via INTRAVENOUS
  Filled 2013-07-09 (×3): qty 900

## 2013-07-09 MED ORDER — INSULIN ASPART 100 UNIT/ML ~~LOC~~ SOLN: 0.0000 [IU] | Freq: Three times a day (TID) | SUBCUTANEOUS | Status: DC

## 2013-07-09 MED ORDER — SIMETHICONE 80 MG PO CHEW
160.0000 mg | CHEWABLE_TABLET | Freq: Four times a day (QID) | ORAL | Status: DC | PRN
  Administered 2013-07-09: 160 mg via ORAL
  Filled 2013-07-09: qty 2

## 2013-07-09 MED ORDER — SENNOSIDES-DOCUSATE SODIUM 8.6-50 MG PO TABS
2.0000 | ORAL_TABLET | Freq: Once | ORAL | Status: AC
  Administered 2013-07-09: 2 via ORAL
  Filled 2013-07-09: qty 2

## 2013-07-09 MED ORDER — BISACODYL 10 MG RE SUPP
10.0000 mg | Freq: Once | RECTAL | Status: AC
  Administered 2013-07-09: 10 mg via RECTAL
  Filled 2013-07-09: qty 1

## 2013-07-09 MED ORDER — POTASSIUM CHLORIDE CRYS ER 20 MEQ PO TBCR
40.0000 meq | EXTENDED_RELEASE_TABLET | Freq: Once | ORAL | Status: AC
  Administered 2013-07-09: 40 meq via ORAL
  Filled 2013-07-09: qty 2

## 2013-07-09 NOTE — Progress Notes (Signed)
    Progress Note   Subjective  vomited during the night. So far this am she has tolerated solids (75% of breakfast). No stools.     Objective   Vital signs in last 24 hours: Temp:  [98 F (36.7 C)-99 F (37.2 C)] 98.5 F (36.9 C) (11/12 0600) Pulse Rate:  [78-94] 78 (11/12 0600) Resp:  [18-20] 18 (11/12 0600) BP: (118-144)/(70-89) 118/70 mmHg (11/12 0600) SpO2:  [98 %-100 %] 98 % (11/12 0600) Last BM Date: 07/06/13 General:    Pleasant white female in NAD Abdomen:  Soft, nontender and nondistended. Normal bowel sounds. Neurologic:  Alert and oriented,  grossly normal neurologically. Psych:  Cooperative. Normal mood and affect.    Lab Results:  Recent Labs  07/08/13 0030  WBC 9.4  HGB 14.7  HCT 43.5  PLT 365   BMET  Recent Labs  07/08/13 0030 07/08/13 1650 07/09/13 0525  NA 135 134* 137  K 4.0 4.4 3.2*  CL 94* 106 103  CO2 8* 16* 24  GLUCOSE 102* 201* 218*  BUN 15 10 12   CREATININE 1.57* 1.17* 0.96  CALCIUM 9.8 9.2 8.9   LFT  Recent Labs  07/08/13 0030  PROT 8.2  ALBUMIN 4.3  AST 35  ALT 17  ALKPHOS 113  BILITOT 0.3     Assessment / Plan:   59. 52 year old female with longstanding IBS. Over last 6 months she has been having frequent diarrhea. Extensive workup, including colonoscopy with random biopsies has been negative. Recent addition of Questran has helped diarrhea, maybe slowed bowels too much. I am holding Questran for now. Will give suppository, last BM was 4 days ago.  2. Acute on chronic nausea / vomiting  Suspect there is a functional component to her chronic nausea and vomiting. I do wonder if she has underlying gastroparesis. Not sure why the acute severe nausea and vomiting . No evidence for bowel obstruction. Doubt, but cannot exclude PUD. Continue supportive care. Will proceed with EGD in am, she is agreeable.   3. AKI. Resolved.    4. Metabolic acidosis.  Etiology unclear, extensive evaluation in progress.   5. Depression / anxiety  / fibromyalgia   6. Hypokalemia, repletion in progress per hospitalist    LOS: 1 day   Willette Cluster  07/09/2013, 9:49 AM   [place] [facility] Attending  I have also seen and assessed the patient and agree with the above note.  Iva Boop, MD, [facility] [phone] (pager) 07/09/2013 8:59 PM

## 2013-07-09 NOTE — Progress Notes (Signed)
[name], from [employer], called to inform that all patients toxicology screening were negative. Dr. Izola Price made aware.

## 2013-07-09 NOTE — Progress Notes (Addendum)
Patient ID: [name], female   DOB: March 20, 1961, 52 y.o.   MRN: [id number]  [facility] PROGRESS NOTE  [name] MWU:[id number] DOB: [birth year] DOA: 07/08/2013 PCP: Warrick Parisian, MD  Brief narrative: 52 y.o. female with hx of chronic diarrhea, likely IBS with diarrhea preponderance, anxiety and depression, GERD, hyperlipidemia, presented to ED with main concern of persistent non bloody diarrhea and intermittent episodes of constipation, nausea and non bloody vomiting. In ED, she was found to be in severe metabolic acidosis, with bicarb of 8, and venous pH of 7.1. She has denies fevers, chills, no chest pain or shortness of breath, no specific focal neurological symptoms. Her abdominal pelvic CT was unremarkable on admission.   Principal Problem:   Diarrhea with dehydration - slight clinical improvement but etiology remains unclear - appreciate GI input and will continue to follow up on recommendations - plan for endoscopy in AM - continue IVF for now  Active Problems:   Metabolic acidosis - ? Starvation ketoacidosis -  methanol, ethylene glycol, and isopropyl alcohol levels negative  - IVF provided and this is now resolved - toxic panel negative  - continue IVF and repeat BMP in AM   Nausea and vomiting - unclear etiology, ? gastroparesis vs functional  - plan for EGD in AM   Acute renal failure - secondary to volume depletion - now resolved   Depression/anxiety - Continue effexor    Hypokalemia - mild and will supplement, repeat BM in AM   Hyperglycemia - pt on dextrose fluids, will check A1C, place on SSI sensitive coverage - stop dextrose fluids   Code Status: full  Family Communication: patient  Disposition Plan: inpatient status   Consultants:   GI Procedures:  Ct Abdomen Pelvis W Contrast  07/08/2013 No definite focal abnormality seen to explain the patient's symptoms. Diffuse fatty infiltration within the liver. Apparent mild distal esophageal  wall thickening may reflect sequelae of gastroesophageal reflux.    Dg Abd Acute W/chest  07/08/2013  No acute abnormality.   Antibiotics:   None   HPI/Subjective: No events overnight.   Objective: Filed Vitals:   07/08/13 0700 07/08/13 1427 07/08/13 2200 07/09/13 0600  BP:  144/89 130/80 118/70  Pulse:  84 94 78  Temp:  98 F (36.7 C) 99 F (37.2 C) 98.5 F (36.9 C)  TempSrc:  Oral Oral Oral  Resp:  18 20 18   Height: 5\' 5"  (1.651 m)     Weight: 96.5 kg (212 lb 11.9 oz)     SpO2:  100% 98% 98%    Intake/Output Summary (Last 24 hours) at 07/09/13 1032 Last data filed at 07/09/13 0700  Gross per 24 hour  Intake 3032.5 ml  Output    925 ml  Net 2107.5 ml    Exam:   General:  Pt is alert, follows commands appropriately, not in acute distress  Cardiovascular: Regular rate and rhythm, S1/S2, no murmurs, no rubs, no gallops  Respiratory: Clear to auscultation bilaterally, no wheezing, no crackles, no rhonchi  Abdomen: Soft, tender in epigastric area, non distended, bowel sounds present, no guarding  Extremities: No edema, pulses DP and PT palpable bilaterally  Neuro: Grossly nonfocal  Data Reviewed: Basic Metabolic Panel:  Recent Labs Lab 07/08/13 0030 07/08/13 1650 07/09/13 0525  NA 135 134* 137  K 4.0 4.4 3.2*  CL 94* 106 103  CO2 8* 16* 24  GLUCOSE 102* 201* 218*  BUN 15 10 12   CREATININE 1.57* 1.17* 0.96  CALCIUM 9.8 9.2 8.9  Liver Function Tests:  Recent Labs Lab 07/08/13 0030  AST 35  ALT 17  ALKPHOS 113  BILITOT 0.3  PROT 8.2  ALBUMIN 4.3    Recent Labs Lab 07/08/13 0030  LIPASE 43   CBC:  Recent Labs Lab 07/08/13 0030  WBC 9.4  NEUTROABS 7.8*  HGB 14.7  HCT 43.5  MCV 94.6  PLT 365   Cardiac Enzymes:  Recent Labs Lab 07/08/13 0200 07/08/13 2054  CKTOTAL  --  120  TROPONINI <0.30  --    Scheduled Meds: . atorvastatin  10 mg Oral q1800  . heparin subcutaneous  5,000 Units Subcutaneous Q8H  . meclizine  25 mg  Oral TID  . pantoprazole  40 mg Oral BID AC  . potassium chloride  40 mEq Oral Once  . traZODone  50 mg Oral QHS  . venlafaxine XR  75 mg Oral Daily   Continuous Infusions: . dextrose 5 % and 0.45% NaCl 900 mL with sodium bicarbonate 100 mEq infusion 125 mL/hr at 07/09/13 4098   Emily Presto, MD [facility] Pager [phone]  If 7PM-7AM, please contact night-coverage [email or web address] Password [email or web address] 07/09/2013, 10:32 AM   LOS: 1 day

## 2013-07-09 NOTE — Progress Notes (Signed)
Pt stated that she is going to wear the CPAP "somewhat".  Pt stated that she will be up and down using the restroom.  Pt to notify RN when ready for CPAP so he/she can contact RT.  RT to monitor and assess as needed.

## 2013-07-10 ENCOUNTER — Encounter (HOSPITAL_COMMUNITY)
Admission: EM | Disposition: A | Discharge status: Home / Self Care | Payer: Self-pay | Source: Home / Self Care | Attending: Internal Medicine

## 2013-07-10 ENCOUNTER — Inpatient Hospital Stay (HOSPITAL_COMMUNITY): Payer: Medicare Other | Admitting: Anesthesiology

## 2013-07-10 ENCOUNTER — Encounter (HOSPITAL_COMMUNITY): Payer: Self-pay | Admitting: Anesthesiology

## 2013-07-10 ENCOUNTER — Encounter (HOSPITAL_COMMUNITY): Payer: Medicare Other | Admitting: Anesthesiology

## 2013-07-10 HISTORY — PX: ESOPHAGOGASTRODUODENOSCOPY: SHX5428

## 2013-07-10 LAB — URINE CULTURE: Culture: 25000

## 2013-07-10 LAB — GLUCOSE, CAPILLARY: Glucose-Capillary: 85 mg/dL (ref 70–99)

## 2013-07-10 LAB — BASIC METABOLIC PANEL
BUN: 16 mg/dL (ref 6–23)
CO2: 30 mEq/L (ref 19–32)
Calcium: 9 mg/dL (ref 8.4–10.5)
Chloride: 107 mEq/L (ref 96–112)
Creatinine, Ser: 0.94 mg/dL (ref 0.50–1.10)
GFR calc Af Amer: 79 mL/min — ABNORMAL LOW (ref >90)
Sodium: 143 mEq/L (ref 135–145)

## 2013-07-10 LAB — CBC
HCT: 31.3 % — ABNORMAL LOW (ref 36.0–46.0)
MCH: 31.7 pg (ref 26.0–34.0)
MCHC: 35.5 g/dL (ref 30.0–36.0)
MCV: 89.4 fL (ref 78.0–100.0)
RDW: 13 % (ref 11.5–15.5)
WBC: 3.7 10*3/uL — ABNORMAL LOW (ref 4.0–10.5)

## 2013-07-10 SURGERY — EGD (ESOPHAGOGASTRODUODENOSCOPY)
Anesthesia: Monitor Anesthesia Care

## 2013-07-10 MED ORDER — POTASSIUM CHLORIDE CRYS ER 10 MEQ PO TBCR: 40.0000 meq | EXTENDED_RELEASE_TABLET | Freq: Every day | ORAL | Status: DC

## 2013-07-10 MED ORDER — PANTOPRAZOLE SODIUM 40 MG PO TBEC: 40.0000 mg | DELAYED_RELEASE_TABLET | Freq: Two times a day (BID) | ORAL | Status: DC

## 2013-07-10 MED ORDER — PROPOFOL INFUSION 10 MG/ML OPTIME
INTRAVENOUS | Status: DC | PRN
  Administered 2013-07-10: 160 ug/kg/min via INTRAVENOUS

## 2013-07-10 MED ORDER — POTASSIUM CHLORIDE CRYS ER 20 MEQ PO TBCR
40.0000 meq | EXTENDED_RELEASE_TABLET | Freq: Once | ORAL | Status: AC
  Administered 2013-07-10: 40 meq via ORAL
  Filled 2013-07-10: qty 2

## 2013-07-10 MED ORDER — LACTATED RINGERS IV SOLN
INTRAVENOUS | Status: DC | PRN
  Administered 2013-07-10: 07:00:00 via INTRAVENOUS

## 2013-07-10 MED ORDER — KETAMINE HCL 10 MG/ML IJ SOLN
INTRAMUSCULAR | Status: DC | PRN
  Administered 2013-07-10: 20 mg via INTRAVENOUS

## 2013-07-10 MED ORDER — LACTATED RINGERS IV SOLN
INTRAVENOUS | Status: DC
  Administered 2013-07-10: 10:00:00 via INTRAVENOUS

## 2013-07-10 NOTE — Discharge Summary (Signed)
Physician Discharge Summary  [name] YNW:[id number] DOB: 1961-02-24 DOA: 07/08/2013  PCP: Warrick Parisian, MD  Admit date: 07/08/2013 Discharge date: 07/10/2013  Recommendations for Outpatient Follow-up:  1. Pt will need to follow up with PCP in 2-3 weeks post discharge 2. Please obtain BMP to evaluate electrolytes and kidney function and potassium level  3. Please note that pt has received total of 80 MEQ of K-dur prior to discharge  4. Please also check CBC to evaluate Hg and Hct levels 5. Pt advised to follow up with GI specialist in 4-6 weeks post discharge   Discharge Diagnoses: Diarrhea secondary to IBS (irritable bowel syndrome)  Principal Problem:   Diarrhea with dehydration Active Problems:   Weakness   Fibromyalgia   Irritable bowel syndrome (IBS)   Volume depletion   Metabolic acidosis   Nausea and vomiting   Vertigo  Discharge Condition: Stable  Diet recommendation: Heart healthy diet discussed in details   History of present illness:  52 y.o. female with hx of chronic diarrhea, likely IBS with diarrhea preponderance, anxiety and depression, GERD, hyperlipidemia, presented to ED with main concern of persistent non bloody diarrhea and intermittent episodes of constipation, nausea and non bloody vomiting. In ED, she was found to be in severe metabolic acidosis, with bicarb of 8, and venous pH of 7.1. She has denies fevers, chills, no chest pain or shortness of breath, no specific focal neurological symptoms. Her abdominal pelvic CT was unremarkable on admission.   Principal Problem:  Diarrhea with dehydration  - clinically improved with IVF, analgesia and antiemetics as needed - this was determined to be secondary to irritable bowel syndrome  - appreciate GI input - no findings on endoscopy  - pt tolerating current diet well and stable for discharge  Active Problems:  Metabolic acidosis  - ? Starvation ketoacidosis  - methanol, ethylene glycol, and  isopropyl alcohol levels negative  - IVF provided and this is now resolved  - toxic panel negative  Nausea and vomiting  - unclear etiology, ? gastroparesis vs functional in the setting of IBS as noted above  - unremarkable EGD Acute renal failure  - secondary to volume depletion  - now resolved  Depression/anxiety  - Continue effexor  Hypokalemia  - given 80 MEQ prior to discharge and will need to have BMP checked on follow up with PCP - pt made aware  Hyperglycemia  - pt on dextrose fluids, placed on SSI sensitive coverage while inpatient  - stopped dextrose fluids and this has resolved   Code Status: full  Family Communication: patient   Consultants:  GI Procedures:  Ct Abdomen Pelvis W Contrast 07/08/2013 No definite focal abnormality seen to explain the patient's symptoms. Diffuse fatty infiltration within the liver. Apparent mild distal esophageal wall thickening may reflect sequelae of gastroesophageal reflux.  Dg Abd Acute W/chest 07/08/2013 No acute abnormality.  Antibiotics:  None    Discharge Exam: Filed Vitals:   07/10/13 0932  BP: 148/95  Pulse: 75  Temp: 98.4 F (36.9 C)  Resp: 16   Filed Vitals:   07/10/13 0840 07/10/13 0841 07/10/13 0850 07/10/13 0932  BP: 127/69 127/69 126/75 148/95  Pulse:  65  75  Temp:    98.4 F (36.9 C)  TempSrc:    Oral  Resp: 11  12 16   Height:      Weight:      SpO2: 100% 100% 97% 98%    General: Pt is alert, follows commands appropriately, not in acute distress Cardiovascular:  Regular rate and rhythm, S1/S2 +, no murmurs, no rubs, no gallops Respiratory: Clear to auscultation bilaterally, no wheezing, no crackles, no rhonchi Abdominal: Soft, non tender, non distended, bowel sounds +, no guarding Extremities: no edema, no cyanosis, pulses palpable bilaterally DP and PT Neuro: Grossly nonfocal  Discharge Instructions  Discharge Orders   Future Appointments Provider Department Dept Phone   07/21/2013 2:00 PM Beverley Fiedler, MD [facility] [phone]   10/06/2013 2:30 PM Suanne Marker, MD [facility] [phone]   Future Orders Complete By Expires   Diet - low sodium heart healthy  As directed    Increase activity slowly  As directed        Medication List         acetaminophen 500 MG tablet  Commonly known as:  TYLENOL  Take 500 mg by mouth every 6 (six) hours as needed for pain.     cholestyramine 4 G packet  Commonly known as:  QUESTRAN  Take one packet daily before a meal and take at least  2 hours before other medications to avoid decreased absorption . If one packet does not help with diarrhea, may increase to twice daily, DO NOT TAKE ANTI DIARRHEALS LIKE IMODIUM OR LOMOTIL WHILE TAKING CHOLESTYRAMINE.     clonazePAM 0.5 MG tablet  Commonly known as:  KLONOPIN  Take by mouth 3 (three) times daily.     CRESTOR 5 MG tablet  Generic drug:  rosuvastatin  1 day or 1 dose.     hydrOXYzine 25 MG capsule  Commonly known as:  VISTARIL  Take 1 capsule by mouth daily.     ipratropium 17 MCG/ACT inhaler  Commonly known as:  ATROVENT HFA  Inhale 2 puffs into the lungs every 6 (six) hours.     pantoprazole 40 MG tablet  Commonly known as:  PROTONIX  Take 1 tablet (40 mg total) by mouth 2 (two) times daily before a meal.     potassium chloride 10 MEQ tablet  Commonly known as:  K-DUR,KLOR-CON  Take 4 tablets (40 mEq total) by mouth daily.     SUMAtriptan 25 MG tablet  Commonly known as:  IMITREX  Take 1 tablet by mouth daily.     temazepam 15 MG capsule  Commonly known as:  RESTORIL  1 day or 1 dose.     traZODone 50 MG tablet  Commonly known as:  DESYREL  Take 1 tablet by mouth daily.     venlafaxine XR 75 MG 24 hr capsule  Commonly known as:  EFFEXOR-XR  Take 1 capsule by mouth daily.     Vitamin D (Ergocalciferol) 50000 UNITS Caps capsule  Commonly known as:  DRISDOL  Take 50,000 Units by mouth.           Follow-up  Information   Follow up with Warrick Parisian, MD In 2 weeks.   Specialty:  Family Medicine   Contact information:   [address] [place] [address] [phone]       Follow up with Debbora Presto, MD. (As needed if symptoms worsen)    Specialty:  Internal Medicine   Contact information:   [address] [place] [address] [phone]        The results of significant diagnostics from this hospitalization (including imaging, microbiology, ancillary and laboratory) are listed below for reference.     Microbiology: Recent Results (from the past 240 hour(s))  URINE CULTURE     Status: None  Collection Time    07/08/13  1:07 AM      Result Value Range Status   Specimen Description URINE, CLEAN CATCH   Final   Special Requests NONE   Final   Culture  Setup Time     Final   Value: 07/08/2013 12:25     Performed at [facility]   Culture     Final   Value: 25,000 COLONIES/mL ENTEROCOCCUS SPECIES     Performed at [facility]   Report Status 07/10/2013 FINAL   Final   Organism ID, Bacteria ENTEROCOCCUS SPECIES   Final     Labs: Basic Metabolic Panel:  Recent Labs Lab 07/08/13 0030 07/08/13 1650 07/09/13 0525 07/10/13 0544  NA 135 134* 137 143  K 4.0 4.4 3.2* 2.9*  CL 94* 106 103 107  CO2 8* 16* 24 30  GLUCOSE 102* 201* 218* 98  BUN 15 10 12 16   CREATININE 1.57* 1.17* 0.96 0.94  CALCIUM 9.8 9.2 8.9 9.0   Liver Function Tests:  Recent Labs Lab 07/08/13 0030  AST 35  ALT 17  ALKPHOS 113  BILITOT 0.3  PROT 8.2  ALBUMIN 4.3    Recent Labs Lab 07/08/13 0030  LIPASE 43   No results found for this basename: AMMONIA,  in the last 168 hours CBC:  Recent Labs Lab 07/08/13 0030 07/10/13 0544  WBC 9.4 3.7*  NEUTROABS 7.8*  --   HGB 14.7 11.1*  HCT 43.5 31.3*  MCV 94.6 89.4  PLT 365 184   Cardiac Enzymes:  Recent Labs Lab 07/08/13 0200 07/08/13 2054  CKTOTAL  --  120  TROPONINI <0.30  --     BNP: BNP (last 3 results) No results found for this basename: PROBNP,  in the last 8760 hours CBG:  Recent Labs Lab 07/09/13 1240 07/09/13 1705 07/09/13 2120  GLUCAP 158* 126* 126*     SIGNED: Time coordinating discharge: Over 30 minutes  Debbora Presto, MD  [facility] 07/10/2013, 10:59 AM Pager [phone]  If 7PM-7AM, please contact night-coverage [email or web address] Password [email or web address]

## 2013-07-10 NOTE — Transfer of Care (Signed)
Immediate Anesthesia Transfer of Care Note  Patient: [name]  Procedure(s) Performed: Procedure(s): ESOPHAGOGASTRODUODENOSCOPY (EGD) (N/A)  Patient Location: PACU  Anesthesia Type:MAC  Level of Consciousness: awake, alert  and oriented  Airway & Oxygen Therapy: Patient Spontanous Breathing  Post-op Assessment: Report given to PACU RN and Post -op Vital signs reviewed and stable  Post vital signs: Reviewed and stable  Complications: No apparent anesthesia complications

## 2013-07-10 NOTE — Progress Notes (Signed)
Discharge instructions given to pt, Verbalized understanding. Left the unit in stable condition.

## 2013-07-10 NOTE — Anesthesia Postprocedure Evaluation (Signed)
  Anesthesia Post-op Note  Patient: [name]  Procedure(s) Performed: Procedure(s) (LRB): ESOPHAGOGASTRODUODENOSCOPY (EGD) (N/A)  Patient Location: PACU  Anesthesia Type: MAC  Level of Consciousness: awake and alert   Airway and Oxygen Therapy: Patient Spontanous Breathing  Post-op Pain: mild  Post-op Assessment: Post-op Vital signs reviewed, Patient's Cardiovascular Status Stable, Respiratory Function Stable, Patent Airway and No signs of Nausea or vomiting  Last Vitals:  Filed Vitals:   07/10/13 0932  BP: 148/95  Pulse: 75  Temp: 36.9 C  Resp: 16    Post-op Vital Signs: stable   Complications: No apparent anesthesia complications

## 2013-07-10 NOTE — Op Note (Signed)
[facility] [address] [place], [address]   ENDOSCOPY PROCEDURE REPORT  PATIENT: Emily, Perry  MR#: [id number] BIRTHDATE: [birth year] , 52  yrs. old GENDER: Female ENDOSCOPIST: Iva Boop, MD, [facility] PROCEDURE DATE:  07/10/2013 PROCEDURE:  EGD, diagnostic ASA CLASS:     Class III INDICATIONS:  Vomiting. MEDICATIONS: See Anesthesia Report. TOPICAL ANESTHETIC: none  DESCRIPTION OF PROCEDURE: After the risks benefits and alternatives of the procedure were thoroughly explained, informed consent was obtained.  The Pentax Gastroscope [id number] endoscope was introduced through the mouth and advanced to the second portion of the duodenum. Without limitations.  The instrument was slowly withdrawn as the mucosa was fully examined.      The upper, middle and distal third of the esophagus were carefully inspected and no abnormalities were noted.  The z-line was well seen at the GEJ.  The endoscope was pushed into the fundus which was normal including a retroflexed view.  The antrum, gastric body, first and second part of the duodenum were unremarkable. Retroflexed views revealed no abnormalities.     The scope was then withdrawn from the patient and the procedure completed.  COMPLICATIONS: There were no complications. ENDOSCOPIC IMPRESSION: Normal EGD  RECOMMENDATIONS: Continue supportive care home soon (today ok if tolerating diet) outpatient f/u Dr.  Rhea Belton as planned or we will schedule    eSigned:  Iva Boop, MD, [facility] 07/10/2013 8:51 AM

## 2013-07-10 NOTE — Anesthesia Preprocedure Evaluation (Addendum)
Anesthesia Evaluation  Patient identified by MRN, date of birth, ID band Patient awake  General Assessment Comment: Migraine     .  Anxiety and depression     .  Fibromyalgia     .  Family history of anesthesia complication         mom was slow in awakening   .  Asthma     .  Sleep apnea         uses cpap   .  Anxiety     .  Palpitations         assoc w anxiety   .  IBS (irritable bowel syndrome)     .  GERD (gastroesophageal reflux disease)     .  Shortness of breath         w/ activities and sometimes when lying flat   .  Hyperlipidemia     Reviewed: Allergy & Precautions, H&P , NPO status , Patient's Chart, lab work & pertinent test results  History of Anesthesia Complications (+) Family history of anesthesia reaction  Airway Mallampati: II TM Distance: >3 FB Neck ROM: Full    Dental  (+) Partial Upper, Partial Lower and Dental Advisory Given   Pulmonary shortness of breath, asthma , sleep apnea and Continuous Positive Airway Pressure Ventilation ,  breath sounds clear to auscultation  Pulmonary exam normal       Cardiovascular negative cardio ROS  Rhythm:Regular Rate:Normal     Neuro/Psych  Headaches, PSYCHIATRIC DISORDERS Anxiety  Neuromuscular disease    GI/Hepatic Neg liver ROS, GERD-  ,  Endo/Other  negative endocrine ROS  Renal/GU negative Renal ROS  negative genitourinary   Musculoskeletal  (+) Fibromyalgia -  Abdominal (+) + obese,   Peds negative pediatric ROS (+)  Hematology negative hematology ROS (+)   Anesthesia Other Findings   Reproductive/Obstetrics negative OB ROS                         Anesthesia Physical Anesthesia Plan  ASA: III  Anesthesia Plan: MAC   Post-op Pain Management:    Induction: Intravenous  Airway Management Planned:   Additional Equipment:   Intra-op Plan:   Post-operative Plan:   Informed Consent: I have reviewed the patients  History and Physical, chart, labs and discussed the procedure including the risks, benefits and alternatives for the proposed anesthesia with the patient or authorized representative who has indicated his/her understanding and acceptance.   Dental advisory given  Plan Discussed with: CRNA  Anesthesia Plan Comments:         Anesthesia Quick Evaluation

## 2013-07-11 ENCOUNTER — Encounter (HOSPITAL_COMMUNITY): Payer: Self-pay | Admitting: Internal Medicine

## 2013-07-14 LAB — HEAVY METALS, RANDOM URINE
Arsenic Random, Urine: 40 mcg/g creat (ref <51)
Creatinine Random, Urine: 127 mg/dL (ref 20.0–320.0)
Lead Random, Urine: 1 mcg/g creat (ref <10)

## 2013-07-21 ENCOUNTER — Other Ambulatory Visit (INDEPENDENT_AMBULATORY_CARE_PROVIDER_SITE_OTHER): Payer: Medicare Other

## 2013-07-21 ENCOUNTER — Encounter: Payer: Self-pay | Admitting: Internal Medicine

## 2013-07-21 ENCOUNTER — Ambulatory Visit (INDEPENDENT_AMBULATORY_CARE_PROVIDER_SITE_OTHER): Payer: Medicare Other | Admitting: Internal Medicine

## 2013-07-21 VITALS — BP 98/74 | HR 64 | Ht 65.75 in | Wt 206.8 lb

## 2013-07-21 DIAGNOSIS — R197 Diarrhea, unspecified: Secondary | ICD-10-CM

## 2013-07-21 DIAGNOSIS — K589 Irritable bowel syndrome without diarrhea: Secondary | ICD-10-CM

## 2013-07-21 DIAGNOSIS — E872 Acidosis: Secondary | ICD-10-CM

## 2013-07-21 LAB — BASIC METABOLIC PANEL
BUN: 16 mg/dL (ref 6–23)
CO2: 24 mEq/L (ref 19–32)
Calcium: 9.7 mg/dL (ref 8.4–10.5)
Chloride: 108 mEq/L (ref 96–112)
Creatinine, Ser: 1.3 mg/dL — ABNORMAL HIGH (ref 0.4–1.2)
Glucose, Bld: 103 mg/dL — ABNORMAL HIGH (ref 70–99)
Sodium: 139 mEq/L (ref 135–145)

## 2013-07-21 LAB — MAGNESIUM: Magnesium: 1.8 mg/dL (ref 1.5–2.5)

## 2013-07-21 MED ORDER — CHOLESTYRAMINE 4 G PO PACK: PACK | ORAL | Status: DC

## 2013-07-21 MED ORDER — ONDANSETRON HCL 4 MG PO TABS: 4.0000 mg | ORAL_TABLET | Freq: Three times a day (TID) | ORAL | Status: DC | PRN

## 2013-07-21 NOTE — Patient Instructions (Signed)
Your physician has requested that you go to the basement for the following lab work before leaving today: Magnesium, Bmet  We have sent the following medications to your pharmacy for you to pick up at your convenience: Zofran, Questran, take 1 packet every other day, if still having loose stools start taking 1 packet everyday.                                               We are excited to introduce MyChart, a new best-in-class service that provides you online access to important information in your electronic medical record. We want to make it easier for you to view your health information - all in one secure location - when and where you need it. We expect MyChart will enhance the quality of care and service we provide.  When you register for MyChart, you can:    View your test results.    Request appointments and receive appointment reminders via email.    Request medication renewals.    View your medical history, allergies, medications and immunizations.    Communicate with your physician's office through a password-protected site.    Conveniently print information such as your medication lists.  To find out if MyChart is right for you, please talk to a member of our clinical staff today. We will gladly answer your questions about this free health and wellness tool.  If you are age 52 or older and want a member of your family to have access to your record, you must provide written consent by completing a proxy form available at our office. Please speak to our clinical staff about guidelines regarding accounts for patients younger than age 13.  As you activate your MyChart account and need any technical assistance, please call the MyChart technical support line at [phone]-[phone]) or email your question to [email or web address]. If you email your question(s), please include your name, a return phone number and the best time to reach you.  If you have non-urgent  health-related questions, you can send a message to our office through MyChart at [email or web address]. If you have a medical emergency, call 911.  Thank you for using MyChart as your new health and wellness resource!   MyChart licensed from [employer],  4540-9811. Patents Pending.

## 2013-07-21 NOTE — Progress Notes (Signed)
Subjective:    Patient ID: [name], female    DOB: 1960-10-02, 52 y.o.   MRN: [id number]  HPI [name] is a 51 yo female with a past medical history of fibromyalgia, anxiety, migraines, sleep apnea, GERD, hyperlipidemia, irritable bowel syndrome, and colon polyps who is seen in followup. She was initially seen in October to evaluate chronic diarrhea and lower abdominal pain. She had a colonoscopy performed on 06/23/2013 which revealed 9 polyps, all less than 1 cm, which were found to be tubular adenomas and sessile serrated adenomas. Random biopsies were performed which showed no evidence of inflammation or microscopic colitis. Subsequent to this she was admitted to the hospital from 07/08/2013 to 07/10/2013 for diarrhea and metabolic acidosis. No absolute etiology was found for her metabolic acidosis but it was corrected while she was hospitalized. She also had a mild acute renal injury felt secondary to prerenal state. During this time she had a CT scan of her abdomen and pelvis which showed possible esophageal thickening an upper endoscopy performed by Dr. Leone Perry which was normal.  Today she returns stating that overall she is feeling better. She is working to stay hydrated and drinking Gatorade throughout the day. She is still having diarrhea occurring 5-7 times per day. It is light greenish in color. She has not seen blood in her stool or melena. She is not having significant abdominal pain at this time. She is eating fairly well and denies nausea or vomiting, though she did have vomiting for several days prior to her hospitalization. On review of my note from mid October vomiting was not a major issue at that time, and she endorses that indeed it was more acute proceed her hospital stay. She was trying cholestyramine 4 g daily before hospitalization which helped with her diarrhea and in fact caused constipation. We have changed it to every other day but this was stopped in the hospital during  her evaluation. She did try an over-the-counter probiotic without much benefit.  No fevers or chills.   Review of Systems As per history of present illness, otherwise negative  Current Medications, Allergies, Past Medical History, Past Surgical History, Family History and Social History were reviewed in [employer] record.     Objective:   Physical Exam BP 98/74  Pulse 64  Ht 5' 5.75" (1.67 m)  Wt 206 lb 12.8 oz (93.804 kg)  BMI 33.63 kg/m2 Constitutional: Well-developed and well-nourished. No distress. HEENT: Normocephalic and atraumatic.  No scleral icterus. Neurological: Alert and oriented to person place and time. Skin: Skin is warm and dry. No rashes noted. Psychiatric: Normal mood and affect. Behavior is normal.  CBC    Component Value Date/Time   WBC 3.7* 07/10/2013 0544   RBC 3.50* 07/10/2013 0544   HGB 11.1* 07/10/2013 0544   HCT 31.3* 07/10/2013 0544   PLT 184 07/10/2013 0544   MCV 89.4 07/10/2013 0544   MCH 31.7 07/10/2013 0544   MCHC 35.5 07/10/2013 0544   RDW 13.0 07/10/2013 0544   LYMPHSABS 0.8 07/08/2013 0030   MONOABS 0.8 07/08/2013 0030   EOSABS 0.0 07/08/2013 0030   BASOSABS 0.0 07/08/2013 0030    CMP     Component Value Date/Time   NA 139 07/21/2013 1441   K 3.4* 07/21/2013 1441   CL 108 07/21/2013 1441   CO2 24 07/21/2013 1441   GLUCOSE 103* 07/21/2013 1441   BUN 16 07/21/2013 1441   CREATININE 1.3* 07/21/2013 1441   CALCIUM 9.7 07/21/2013 1441  PROT 8.2 07/08/2013 0030   ALBUMIN 4.3 07/08/2013 0030   AST 35 07/08/2013 0030   ALT 17 07/08/2013 0030   ALKPHOS 113 07/08/2013 0030   BILITOT 0.3 07/08/2013 0030   GFRNONAA 69* 07/10/2013 0544   GFRAA 79* 07/10/2013 0544    EGD - [name] - normal     Assessment & Plan:   52 yo female with a past medical history of fibromyalgia, anxiety, migraines, sleep apnea, GERD, hyperlipidemia, irritable bowel syndrome, and colon polyps who is seen in followup.  1.   Diarrhea, chronic -- no clear etiology, question medication related. Could also be a component of irritable bowel.  She did have a response to cholestyramine, though the 4 g dose caused some constipation.  I will put her on 2 g every other day for now. If her stools remain loose she can take 2 g daily. She is not having much nausea but asked for a prescription for antinausea medicine should this be necessary. We'll give her Zofran 4 mg to be used as needed and as directed for nausea. If the Questran fails to help her diarrhea I have asked that she notify me and she voices understanding.  2.  Metabolic acidosis with acute renal injury -- I will recheck basic metabolic panel today and a magnesium level. Metabolic acidosis had resolved at the time of her hospital discharge

## 2013-07-28 ENCOUNTER — Telehealth: Payer: Self-pay | Admitting: Internal Medicine

## 2013-07-28 NOTE — Telephone Encounter (Signed)
Patient given results

## 2013-07-28 NOTE — Telephone Encounter (Signed)
Left a message for patient to call back. 

## 2013-10-06 ENCOUNTER — Ambulatory Visit (INDEPENDENT_AMBULATORY_CARE_PROVIDER_SITE_OTHER): Payer: Medicare Other | Admitting: Diagnostic Neuroimaging

## 2013-10-06 ENCOUNTER — Encounter: Payer: Self-pay | Admitting: Diagnostic Neuroimaging

## 2013-10-06 VITALS — BP 114/82 | Temp 99.0°F | Resp 98 | Ht 65.5 in | Wt 209.0 lb

## 2013-10-06 DIAGNOSIS — R259 Unspecified abnormal involuntary movements: Secondary | ICD-10-CM

## 2013-10-06 DIAGNOSIS — R251 Tremor, unspecified: Secondary | ICD-10-CM

## 2013-10-06 DIAGNOSIS — G43909 Migraine, unspecified, not intractable, without status migrainosus: Secondary | ICD-10-CM

## 2013-10-06 DIAGNOSIS — IMO0001 Reserved for inherently not codable concepts without codable children: Secondary | ICD-10-CM

## 2013-10-06 DIAGNOSIS — M797 Fibromyalgia: Secondary | ICD-10-CM

## 2013-10-06 NOTE — Progress Notes (Signed)
[facility]  PATIENT: [name] DOB: 06/22/1961  REFERRING CLINICIAN:  HISTORY FROM: patient  REASON FOR VISIT: follow up    HISTORICAL  CHIEF COMPLAINT:  Chief Complaint  Patient presents with  . Follow-up    tremor    HISTORY OF PRESENT ILLNESS:   UPDATE 10/06/13: Since last visit, headaches have worsened, now daily. She has exacerbation every other day. She is a sumatriptan and Tylenol as needed for headaches. Also still struggling with irritable bowel syndrome, fibromyalgia, chronic pain, depression, anxiety, insomnia. Still struggling with unresolved psychological stress.  PRIOR HPI (01/29/13): 53 year old right-handed female here for evaluation of headaches, vision changes, muscle cramps, pain.  Patient reports long history of migraine headaches, 20 days of headache per month. These are bilateral severe headaches with nausea, vomiting, photophobia and phonophobia. Headaches can last up to one to 2 days at a time. She thinks she may have tried amitriptyline and Topamax the past.  Also patient is concerned about possibility of multiple sclerosis. Patient's sister diagnosed with multiple scars 2004 and the patient feels she is having similar symptoms. She describes intermittent cramps of her right hand lasting for a few seconds a time. She has intermittent left facial pain. She has had intermittent postural tremor for the past 3-4 months. Tremor can also occur at rest. She has significant gait deterioration over the past one to 2 years. She is having some urinary and bowel control problems. Also reports numerous things and review of systems.  REVIEW OF SYSTEMS: Full 14 system review of systems performed and notable only for fatigue fever and neck pain stiffness year discharge ear pain runny nose trouble swelling drooling cough wheezing choking palpitation double vision loss of vision excessive thirst abdominal pain constipation diarrhea nausea vomiting restless  legs insomnia snoring sleepwalking acting out dreams joint pain joint swelling aching muscles difficulty confusion depression anxiety weakness tremor memory loss dizziness headache painful urination easy bruising and bleeding   ALLERGIES: Allergies  Allergen Reactions  . Aspirin Swelling  . Lyrica [Pregabalin] Other (See Comments)    Wt gain, fluid retention  . Benzocaine Rash    HOME MEDICATIONS: Outpatient Prescriptions Prior to Visit  Medication Sig Dispense Refill  . acetaminophen (TYLENOL) 500 MG tablet Take 500 mg by mouth every 6 (six) hours as needed for pain.      . cholestyramine (QUESTRAN) 4 G packet Take 1/2 packet every other day for diarrhea, if still having loose stools may start taking 1/2 packet every day DO NOT TAKE ANTI [place].  60 each  12  . clonazePAM (KLONOPIN) 0.5 MG tablet Take by mouth 3 (three) times daily.      . CRESTOR 5 MG tablet 1 day or 1 dose.      . hydrOXYzine (VISTARIL) 25 MG capsule Take 1 capsule by mouth daily.      Marland Kitchen ipratropium (ATROVENT HFA) 17 MCG/ACT inhaler Inhale 2 puffs into the lungs every 6 (six) hours.      . ondansetron (ZOFRAN) 4 MG tablet Take 1 tablet (4 mg total) by mouth every 8 (eight) hours as needed for nausea.  30 tablet  1  . pantoprazole (PROTONIX) 40 MG tablet Take 1 tablet (40 mg total) by mouth 2 (two) times daily before a meal.  60 tablet  1  . temazepam (RESTORIL) 15 MG capsule 1 day or 1 dose.      . traZODone (DESYREL) 50 MG tablet Take 1 tablet by  mouth daily.      Marland Kitchen venlafaxine XR (EFFEXOR-XR) 75 MG 24 hr capsule Take 1 capsule by mouth daily.      . Vitamin D, Ergocalciferol, (DRISDOL) 50000 UNITS CAPS Take 50,000 Units by mouth.      . potassium chloride SA (K-DUR,KLOR-CON) 10 MEQ tablet Take 4 tablets (40 mEq total) by mouth daily.  10 tablet  0  . SUMAtriptan (IMITREX) 25 MG tablet Take 1 tablet by mouth daily.       No facility-administered medications  prior to visit.    PAST MEDICAL HISTORY: Past Medical History  Diagnosis Date  . Migraine   . Anxiety and depression   . Fibromyalgia   . Family history of anesthesia complication     mom was slow in awakening  . Asthma   . Sleep apnea     uses cpap  . Anxiety   . Palpitations     assoc w anxiety  . IBS (irritable bowel syndrome)   . GERD (gastroesophageal reflux disease)   . Shortness of breath     w/ activities and sometimes when lying flat  . Hyperlipidemia     PAST SURGICAL HISTORY: Past Surgical History  Procedure Laterality Date  . Ectopic pregnancy surgery    . Elbow surgery    . Vaginal hysterectomy    . Hernia repair    . Cholecystectomy    . Knee surgery    . Knee arthroscopy with lateral menisectomy Right 02/20/2013    Procedure: RIGHT KNEE ARTHROSCOPY WITH MEDIAL AND LATERAL MENISECTOMY, Abrasion Chondroplasty of medial and lateral femoral condyle, microfracture of medial and lateral condyle, synovectomy of supra patella pouch;  Surgeon: Tobi Bastos, MD;  Location: [facility];  Service: Orthopedics;  Laterality: Right;  . Esophagogastroduodenoscopy N/A 07/10/2013    Procedure: ESOPHAGOGASTRODUODENOSCOPY (EGD);  Surgeon: Gatha Mayer, MD;  Location: [facility];  Service: Endoscopy;  Laterality: N/A;    FAMILY HISTORY: Family History  Problem Relation Age of Onset  . Multiple sclerosis Sister   . Kidney failure Sister   . Depression    . Diabetes    . Heart attack    . Breast cancer Paternal Grandmother   . Colon cancer Neg Hx   . Esophageal cancer Neg Hx   . Stomach cancer Neg Hx     SOCIAL HISTORY:  History   Social History  . Marital Status: Divorced    Spouse Name: N/A    Number of Children: 1  . Years of Education: 12th   Occupational History  . N/A    Social History Main Topics  . Smoking status: Never Smoker   . Smokeless tobacco: Never Used  . Alcohol Use: Yes     Comment: occasionally  . Drug Use: No  .  Sexual Activity: Not on file   Other Topics Concern  . Not on file   Social History Narrative   Pt lives at home alone.   Caffeine Use: none; quit years ago     PHYSICAL EXAM  Filed Vitals:   10/06/13 1418  BP: 114/82  Temp: 99 F (37.2 C)  TempSrc: Oral  Resp: 98  Height: 5' 5.5" (1.664 m)  Weight: 209 lb (94.802 kg)    Not recorded    Body mass index is 34.24 kg/(m^2).  GENERAL EXAM: Patient is in no distress  CARDIOVASCULAR: Regular rate and rhythm, no murmurs, no carotid bruits  NEUROLOGIC: MENTAL STATUS: awake, alert, language fluent, comprehension intact, naming  intact CRANIAL NERVE: no papilledema on fundoscopic exam, pupils equal and reactive to light, visual fields full to confrontation, extraocular muscles intact, no nystagmus, facial sensation and strength symmetric, uvula midline, shoulder shrug symmetric, tongue midline. MOTOR: POSTURAL AND RESTING TREMOR IN BUE. FULL STRENGTH IN BUE.  SENSORY: DECR SENS IN TOES.  COORDINATION: BUE INTENTION TREMOR. REFLEXES: BUE 2, KNEES 2, ANKLES 1.  GAIT/STATION: UNSTEADY, ANTALGIC GAIT.    DIAGNOSTIC DATA (LABS, IMAGING, TESTING) - I reviewed patient records, labs, notes, testing and imaging myself where available.  Lab Results  Component Value Date   WBC 3.7* 07/10/2013      Component Value Date/Time   NA 139 07/21/2013 1441   No results found for this basename: CHOL,  HDL,  LDLCALC,  LDLDIRECT,  TRIG,  CHOLHDL   Lab Results  Component Value Date   HGBA1C 4.9 07/09/2013   No results found for this basename: VITAMINB12   Lab Results  Component Value Date   TSH 4.26 06/12/2013    I reviewed images myself and agree with interpretation. -VRP  02/09/13 MRI BRAIN - normal  02/09/13 MRI CERVICAL - mild disc bulging from C3-4 down to T2-3. No spinal stenosis or foraminal narrowing. No intrinsic or compressive spinal cord lesions.   ASSESSMENT AND PLAN  53 y.o. year old female  has a past medical  history of Migraine; Anxiety and depression; Fibromyalgia; Family history of anesthesia complication; Asthma; Sleep apnea; Anxiety; Palpitations; IBS (irritable bowel syndrome); GERD (gastroesophageal reflux disease); Shortness of breath; and Hyperlipidemia. here with migraine headaches and tremor. Continue sumatriptan for headaches. 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 Patient needs better control of pain and mood disorders. She will discuss with PCP and pursue treatment, possibly with pain clinic and psychiatry evaluations.  Return if symptoms worsen or fail to improve, for return to PCP.     Penni Bombard, MD 05/03/931, 6:71 PM Certified in Neurology, Neurophysiology and Neuroimaging  [facility] [address], [place], [place] [address] [phone]

## 2014-01-02 ENCOUNTER — Other Ambulatory Visit: Payer: Self-pay | Admitting: Internal Medicine

## 2014-01-26 ENCOUNTER — Other Ambulatory Visit: Payer: Self-pay | Admitting: Internal Medicine

## 2014-01-26 DIAGNOSIS — Z1231 Encounter for screening mammogram for malignant neoplasm of breast: Secondary | ICD-10-CM

## 2014-02-05 ENCOUNTER — Ambulatory Visit: Payer: Medicare Other

## 2014-03-10 ENCOUNTER — Ambulatory Visit: Payer: Medicare Other

## 2014-06-05 ENCOUNTER — Ambulatory Visit: Payer: Medicare Other

## 2014-06-12 ENCOUNTER — Ambulatory Visit: Payer: Medicare Other

## 2014-08-19 ENCOUNTER — Ambulatory Visit: Payer: Medicare Other

## 2015-02-01 ENCOUNTER — Other Ambulatory Visit: Payer: Self-pay | Admitting: Physician Assistant

## 2015-02-01 DIAGNOSIS — Z1231 Encounter for screening mammogram for malignant neoplasm of breast: Secondary | ICD-10-CM

## 2015-02-16 ENCOUNTER — Ambulatory Visit: Payer: Self-pay

## 2015-03-05 ENCOUNTER — Ambulatory Visit: Payer: Self-pay

## 2015-05-13 ENCOUNTER — Other Ambulatory Visit: Payer: Self-pay

## 2015-05-13 DIAGNOSIS — Z1231 Encounter for screening mammogram for malignant neoplasm of breast: Secondary | ICD-10-CM

## 2015-06-09 ENCOUNTER — Ambulatory Visit
Admission: RE | Admit: 2015-06-09 | Discharge: 2015-06-09 | Disposition: A | Discharge status: Home / Self Care | Payer: Medicare Other | Source: Ambulatory Visit

## 2015-06-09 DIAGNOSIS — Z1231 Encounter for screening mammogram for malignant neoplasm of breast: Secondary | ICD-10-CM

## 2015-10-20 ENCOUNTER — Encounter: Payer: Self-pay | Admitting: *Deleted

## 2015-10-22 ENCOUNTER — Ambulatory Visit (INDEPENDENT_AMBULATORY_CARE_PROVIDER_SITE_OTHER): Payer: Medicare Other | Admitting: Internal Medicine

## 2015-10-22 ENCOUNTER — Encounter: Payer: Self-pay | Admitting: Internal Medicine

## 2015-10-22 ENCOUNTER — Other Ambulatory Visit: Payer: Medicare Other

## 2015-10-22 VITALS — BP 124/80 | HR 104 | Ht 65.0 in | Wt 218.5 lb

## 2015-10-22 DIAGNOSIS — K219 Gastro-esophageal reflux disease without esophagitis: Secondary | ICD-10-CM | POA: Diagnosis not present

## 2015-10-22 DIAGNOSIS — R112 Nausea with vomiting, unspecified: Secondary | ICD-10-CM | POA: Diagnosis not present

## 2015-10-22 DIAGNOSIS — K529 Noninfective gastroenteritis and colitis, unspecified: Secondary | ICD-10-CM

## 2015-10-22 MED ORDER — HYOSCYAMINE SULFATE ER 0.375 MG PO TB12: 0.3750 mg | ORAL_TABLET | Freq: Two times a day (BID) | ORAL | Status: DC

## 2015-10-22 MED ORDER — ELUXADOLINE 75 MG PO TABS: 1.0000 | ORAL_TABLET | Freq: Every day | ORAL | Status: DC

## 2015-10-22 MED ORDER — DIPHENOXYLATE-ATROPINE 2.5-0.025 MG PO TABS: 1.0000 | ORAL_TABLET | Freq: Four times a day (QID) | ORAL | Status: DC | PRN

## 2015-10-22 MED ORDER — NA SULFATE-K SULFATE-MG SULF 17.5-3.13-1.6 GM/177ML PO SOLN: ORAL | Status: DC

## 2015-10-22 NOTE — Progress Notes (Signed)
Subjective:    Patient ID: [name], female    DOB: [birth year], 55 y.o.   MRN: [id number]  HPI [name] is a 55 year old female with a past medical history of chronic diarrhea, adenomatous colon polyps, IBS, GERD who is seen for follow-up. She was last seen in November 2014. She also has a history of fibromyalgia, migraines, sleep apnea, anxiety, hyperlipidemia.  She had a colonoscopy on 06/23/2013 which revealed benign polyps all less than 1 cm found to be tubular adenoma or sessile serrated polyp. Random biopsies negative for inflammation or microscopic colitis. Subsequent admission to the hospital in November 2014 for diarrhea and metabolic acidosis. Acidosis corrected but no etiology discovered. Upper endoscopy during that hospitalization was normal.  She has had return of severe diarrhea. This is happening 20-30 times per day. Also nocturnally. Associated with lower abdominal cramping and at times nausea with vomiting. Reflux continues to be a problem for her despite pantoprazole 40 mg daily. Diarrhea is interfering with life activities. She feels scared to leave the house. At times she feels that she needs to pass gas but hasn't accident and loss of stool. Sees mucus in her stool but denies blood or melena. No new medications. She continues Effexor which helps her with her fibromyalgia. She is also taking trazodone, Crestor. She tried cholestyramine and was up to 6 g per day but this caused constipation. She states for about 12 hours she wouldn't have a bowel movement but felt like she needed to and with bowel movements started back it was even worse diarrhea and cramping. She is unhappy with the way this medication works for her. She is not currently using Zofran though it is on her med list. She denies weight loss.   Review of Systems As per history of present illness, otherwise negative  Current Medications, Allergies, Past Medical History, Past Surgical History, Family History  and Social History were reviewed in [employer] record.     Objective:   Physical Exam BP 124/80 mmHg  Pulse 104  Ht 5\' 5"  (1.651 m)  Wt 218 lb 8 oz (99.111 kg)  BMI 36.36 kg/m2 Constitutional: Well-developed and well-nourished. No distress. HEENT: Normocephalic and atraumatic. Oropharynx is clear and moist. No oropharyngeal exudate. Conjunctivae are normal.  No scleral icterus. Neck: Neck supple. Trachea midline. Cardiovascular: Normal rate, regular rhythm and intact distal pulses. No M/R/G Pulmonary/chest: Effort normal and breath sounds normal. No wheezing, rales or rhonchi. Abdominal: Soft, mild diffuse tenderness without rebound or guarding, nondistended. Bowel sounds active throughout.  Extremities: no clubbing, cyanosis, or edema Neurological: Alert and oriented to person place and time. Skin: Skin is warm and dry.  Psychiatric: Somewhat flat affect and at times tearful during the encounter. Behavior is normal.   CT scan performed for abdominal pain and diarrhea in November 2014 -- study reviewed IMPRESSION: 1. No definite focal abnormality seen to explain the patient's symptoms. 2. Diffuse fatty infiltration within the liver. 3. Apparent mild distal esophageal wall thickening may reflect sequelae of gastroesophageal reflux.   Electronically Signed  By: Garald Balding M.D.  On: 07/08/2013 03:28    Assessment & Plan:  55 year old female with a past medical history of chronic diarrhea, adenomatous colon polyps, IBS, GERD who is seen for follow-up.  1. Chronic, severe diarrhea -- severe by symptoms. Microscopic colitis and other evaluation of diarrhea unremarkable in 2014. Symptoms seemed to improve without specific treatment after initial evaluation. Given severity of symptoms now have recommended stool studies to include  GI pathogen panel, fecal calprotectin, fecal elastase. She is due repeat surveillance colonoscopy this year and so we will  repeat the test at this time. We discussed the risks, benefits and alternatives and she is agreeable to proceed. Plan random biopsies again to exclude microscopic colitis. Trial of Viberzi 75 mg twice a day with food. Also going to give Levbid twice a day for cramping abdominal pain. Can use Lomotil 1 4 times a day on an as-needed basis. Discontinue Questran. We discussed how medication such as venlafaxine can cause diarrhea though this medication predates return of symptoms.  2. GERD/nausea vomiting -- felt most likely secondary to crampy lower abdominal pain. Continue pantoprazole 40 mg daily. Upper endoscopy recommended again given significant upper GI symptoms specifically ongoing heartburn and reflux despite pantoprazole, nausea and vomiting. We discussed the risks, benefits and alternatives to upper and lower endoscopy and she is agreeable to proceed.  25 minutes spent with the patient today. Greater than 50% was spent in counseling and coordination of care with the patient

## 2015-10-22 NOTE — Patient Instructions (Signed)
You have been scheduled for an endoscopy and colonoscopy. Please follow the written instructions given to you at your visit today. Please pick up your prep supplies at the pharmacy within the next 1-3 days. If you use inhalers (even only as needed), please bring them with you on the day of your procedure. Your physician has requested that you go to www.startemmi.com and enter the access code given to you at your visit today. This web site gives a general overview about your procedure. However, you should still follow specific instructions given to you by our office regarding your preparation for the procedure.  Your physician has requested that you go to the basement for the following lab work before leaving today: GI pathogen panel, fecal elastase, fecal calprotectine  We have sent the following medications to your pharmacy for you to pick up at your convenience: Lomotil four times daily as needed Viberzi 75 mg twice daily with food Levbid twice daily  Please discontinue cholestyramine.

## 2015-10-26 ENCOUNTER — Other Ambulatory Visit: Payer: Self-pay

## 2015-10-26 ENCOUNTER — Other Ambulatory Visit: Payer: Medicare Other

## 2015-10-26 DIAGNOSIS — K529 Noninfective gastroenteritis and colitis, unspecified: Secondary | ICD-10-CM

## 2015-10-27 LAB — GASTROINTESTINAL PATHOGEN PANEL PCR
C. DIFFICILE TOX A/B, PCR: NEGATIVE
Campylobacter, PCR: NEGATIVE
Cryptosporidium, PCR: NEGATIVE
E COLI (STEC) STX1/STX2, PCR: NEGATIVE
E COLI 0157, PCR: NEGATIVE
E coli (ETEC) LT/ST PCR: NEGATIVE
Giardia lamblia, PCR: NEGATIVE
Norovirus, PCR: NEGATIVE
Rotavirus A, PCR: NEGATIVE
SALMONELLA, PCR: NEGATIVE
Shigella, PCR: NEGATIVE

## 2015-10-29 ENCOUNTER — Telehealth: Payer: Self-pay

## 2015-10-29 NOTE — Telephone Encounter (Signed)
Submitted prior authorization for Viberzi to [facility]

## 2015-11-01 ENCOUNTER — Ambulatory Visit (AMBULATORY_SURGERY_CENTER): Payer: Medicare Other | Admitting: Internal Medicine

## 2015-11-01 ENCOUNTER — Encounter: Payer: Self-pay | Admitting: Internal Medicine

## 2015-11-01 VITALS — BP 139/62 | HR 16 | Temp 99.0°F | Resp 16 | Ht 65.0 in | Wt 218.0 lb

## 2015-11-01 DIAGNOSIS — D120 Benign neoplasm of cecum: Secondary | ICD-10-CM

## 2015-11-01 DIAGNOSIS — K3189 Other diseases of stomach and duodenum: Secondary | ICD-10-CM | POA: Diagnosis not present

## 2015-11-01 DIAGNOSIS — K2970 Gastritis, unspecified, without bleeding: Secondary | ICD-10-CM | POA: Diagnosis not present

## 2015-11-01 DIAGNOSIS — Z860100 Personal history of colon polyps, unspecified: Secondary | ICD-10-CM

## 2015-11-01 DIAGNOSIS — K529 Noninfective gastroenteritis and colitis, unspecified: Secondary | ICD-10-CM | POA: Diagnosis not present

## 2015-11-01 DIAGNOSIS — K219 Gastro-esophageal reflux disease without esophagitis: Secondary | ICD-10-CM | POA: Diagnosis not present

## 2015-11-01 DIAGNOSIS — D123 Benign neoplasm of transverse colon: Secondary | ICD-10-CM

## 2015-11-01 DIAGNOSIS — K2990 Gastroduodenitis, unspecified, without bleeding: Secondary | ICD-10-CM | POA: Diagnosis not present

## 2015-11-01 DIAGNOSIS — D125 Benign neoplasm of sigmoid colon: Secondary | ICD-10-CM

## 2015-11-01 DIAGNOSIS — D122 Benign neoplasm of ascending colon: Secondary | ICD-10-CM

## 2015-11-01 DIAGNOSIS — K2980 Duodenitis without bleeding: Secondary | ICD-10-CM | POA: Diagnosis not present

## 2015-11-01 HISTORY — PX: COLONOSCOPY: SHX174

## 2015-11-01 MED ORDER — ELUXADOLINE 75 MG PO TABS: 1.0000 | ORAL_TABLET | Freq: Two times a day (BID) | ORAL | Status: DC

## 2015-11-01 MED ORDER — PANTOPRAZOLE SODIUM 40 MG PO TBEC: DELAYED_RELEASE_TABLET | ORAL | Status: DC

## 2015-11-01 MED ORDER — SODIUM CHLORIDE 0.9 % IV SOLN: 500.0000 mL | INTRAVENOUS | Status: DC

## 2015-11-01 NOTE — Op Note (Signed)
[place]  [address]. [place], [id number]   ENDOSCOPY PROCEDURE REPORT  PATIENT: [name], [name]  MR#: [id number] BIRTHDATE: 21-Feb-1961 , 70  yrs. old GENDER: female ENDOSCOPIST: Jerene Bears, MD PROCEDURE DATE:  11/01/2015 PROCEDURE:  EGD, diagnostic and EGD w/ biopsy ASA CLASS:     Class III INDICATIONS:  diarrhea, heartburn, and history of GERD. MEDICATIONS: Monitored anesthesia care and Propofol 350 mg IV TOPICAL ANESTHETIC: none  DESCRIPTION OF PROCEDURE: After the risks benefits and alternatives of the procedure were thoroughly explained, informed consent was obtained.  The LB [id number] endoscope was introduced through the mouth and advanced to the second portion of the duodenum , Without limitations.  The instrument was slowly withdrawn as the mucosa was fully examined.   ESOPHAGUS: Moderately severe reflux esophagitis was found in the lower third of the esophagus and middle third esophagus.  Multiple biopsies were performed using cold forceps.   A single 68mm nodule was located 18 cm from the incisors.  A biopsy was performed using cold forceps.  STOMACH: There was mild antral gastropathy noted.  Cold forcep biopsies were taken at the gastric body, antrum and angularis.  DUODENUM: Moderate duodenal inflammation was found in the duodenal bulb.   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   The duodenal mucosa showed no abnormalities in the 2nd part of the duodenum.  Cold forceps biopsies were taken in the bulb and second portion.  Retroflexed views revealed no abnormalities.     The scope was then withdrawn from the patient and the procedure completed.  COMPLICATIONS: There were no immediate complications.    ENDOSCOPIC IMPRESSION: 1.   Reflux esophagitis in the lower third of the esophagus and middle third esophagus; multiple biopsies were performed 2.   33mm nodule was located 18 cm from the incisors;  biopsy was performed 3.   There was mild antral gastropathy noted; multiple biopsies 4.   Duodenal inflammation was found in the duodenal bulb 5.   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   The duodenal mucosa showed no abnormalities in the 2nd part of the duodenum cold forceps biopsies were taken in the bulb and second portion  RECOMMENDATIONS: 1.  Await biopsy results 2.  Increase pantoprazole to 40 mg twice a day 3.  GERD diet and antireflux regimen 4.  Proceed with a Colonoscopy.   eSigned:  Jerene Bears, MD 11/01/2015 4:08 PM    CC:the patient, PCP  PATIENT NAME:  [name], [name] MR#: [id number]

## 2015-11-01 NOTE — Progress Notes (Signed)
Called to room to assist during endoscopic procedure.  Patient ID and intended procedure confirmed with present staff. Received instructions for my participation in the procedure from the performing physician.  

## 2015-11-01 NOTE — Progress Notes (Signed)
To pacu vss patent aw report to rn 

## 2015-11-01 NOTE — Patient Instructions (Addendum)

## 2015-11-01 NOTE — Op Note (Signed)
[place]  [address]. [place], [id number]   COLONOSCOPY PROCEDURE REPORT  PATIENT: Emily, Perry  MR#: [id number] BIRTHDATE: May 05, 1961 , 29  yrs. old GENDER: female ENDOSCOPIST: Jerene Bears, MD PROCEDURE DATE:  11/01/2015 PROCEDURE:   Colonoscopy, surveillance , Colonoscopy with biopsy, and Colonoscopy with snare polypectomy First Screening Colonoscopy - Avg.  risk and is 50 yrs.  old or older - No.  Prior Negative Screening - Now for repeat screening. N/A  History of Adenoma - Now for follow-up colonoscopy & has been > or = to 3 yrs.  Yes hx of adenoma.  Has been 3 or more years since last colonoscopy.  Polyps removed today? Yes ASA CLASS:   Class III INDICATIONS:Surveillance due to prior colonic neoplasia, PH Colon Adenoma, 3 or More Adenomas, and chronic diarrhea. MEDICATIONS: Monitored anesthesia care, this was the total dose used for all procedures at this session, and Propofol 750 mg IV  DESCRIPTION OF PROCEDURE:   After the risks benefits and alternatives of the procedure were thoroughly explained, informed consent was obtained.  The digital rectal exam revealed no rectal mass.   The LB [id number]  endoscope was introduced through the anus and advanced to the cecum, which was identified by both the appendix and ileocecal valve. No adverse events experienced. The quality of the prep was good.  (Suprep was used)  The instrument was then slowly withdrawn as the colon was fully examined. Estimated blood loss is zero unless otherwise noted in this procedure report.   COLON FINDINGS: A sessile polyp measuring 5 mm in size was found at the cecum.  A polypectomy was performed with a cold snare.  The resection was complete, the polyp tissue was completely retrieved and sent to histology.   A sessile polyp measuring 4 mm in size was found in the ascending colon.  A polypectomy was performed with a cold snare.  The resection was complete, the polyp  tissue was completely retrieved and sent to histology.   Three sessile polyps ranging from 4 to 52mm in size were found in the transverse colon. Polypectomies were performed with a cold snare.  The resection was complete, the polyp tissue was completely retrieved and sent to histology.   Two sessile polyps ranging between 3-32mm in size were found in the sigmoid colon.  Polypectomies were performed with a cold snare.  The resection was complete, the polyp tissue was completely retrieved and sent to histology.   The colonic mucosa appeared otherwise normal throughout the entire examined colon. Multiple random biopsies were performed using cold forceps. Samples were sent to R/O microscopic colitis.  Retroflexed views revealed no abnormalities. The time to cecum = 3.1 Withdrawal time = 18.2   The scope was withdrawn and the procedure completed.  COMPLICATIONS: There were no immediate complications.  ENDOSCOPIC IMPRESSION: 1.   Sessile polyp was found at the cecum; polypectomy was performed with a cold snare 2.   Sessile polyp was found in the ascending colon; polypectomy was performed with a cold snare 3.   Three sessile polyps ranging from 4 to 68mm in size were found in the transverse colon; polypectomies were performed with a cold snare 4.   Two sessile polyps ranging between 3-31mm in size were found in the sigmoid colon; polypectomies were performed with a cold snare 5.   The colonic mucosa appeared otherwise normal throughout the entire examined colon; multiple random biopsies were performed using cold forceps  RECOMMENDATIONS: 1.  Await pathology  results 2.  Begin Viberzi 75 mg twice daily for chronic diarrhea 3.  Timing of repeat colonoscopy will be determined by pathology findings. 4.  You will receive a letter within 1-2 weeks with the results of your biopsy as well as final recommendations.  Please call my office if you have not received a letter after 3 weeks.  eSigned:   Jerene Bears, MD 11/01/2015 4:13 PM   cc:  the patient, PCP   PATIENT NAME:  Emily, Perry MR#: [id number]

## 2015-11-02 ENCOUNTER — Telehealth: Payer: Self-pay | Admitting: *Deleted

## 2015-11-02 NOTE — Telephone Encounter (Signed)
  Follow up Call-  Call back number 11/01/2015 06/23/2013  Post procedure Call Back phone  # [phone]  Permission to leave phone message Yes Yes     Patient questions:  Do you have a fever, pain , or abdominal swelling? No. Pain Score  0 *  Have you tolerated food without any problems? Yes.    Have you been able to return to your normal activities? Yes.    Do you have any questions about your discharge instructions: Diet   No. Medications  No. Follow up visit  No.  Do you have questions or concerns about your Care? No.  Actions: * If pain score is 4 or above: No action needed, pain <4.

## 2015-11-03 LAB — CALPROTECTIN

## 2015-11-03 LAB — PANCREATIC ELASTASE, FECAL

## 2015-11-04 NOTE — Telephone Encounter (Signed)
Spoke with pharmacist - patients Viberzi was approved at $8 a month and patient had already picked it up.

## 2015-11-05 ENCOUNTER — Encounter: Payer: Self-pay | Admitting: Internal Medicine

## 2015-11-09 ENCOUNTER — Telehealth: Payer: Self-pay | Admitting: Internal Medicine

## 2015-11-09 NOTE — Telephone Encounter (Signed)
Being handled by Carla Drape

## 2015-11-09 NOTE — Telephone Encounter (Signed)
Per pharmacist at [facility], rx for twice daily dosing on Viberzi that we sent on 11/01/15 was never received. I have asked that he fill rx for Viberzi 75 mg twice daily #60 w/2 refills. I have spoken to patient to advise her of this as well. She verbalizes understanding.

## 2016-01-27 ENCOUNTER — Other Ambulatory Visit: Payer: Self-pay | Admitting: *Deleted

## 2016-01-27 DIAGNOSIS — K529 Noninfective gastroenteritis and colitis, unspecified: Secondary | ICD-10-CM

## 2016-01-27 MED ORDER — ELUXADOLINE 75 MG PO TABS: 1.0000 | ORAL_TABLET | Freq: Two times a day (BID) | ORAL | Status: DC

## 2016-03-21 ENCOUNTER — Telehealth: Payer: Self-pay | Admitting: *Deleted

## 2016-03-21 ENCOUNTER — Other Ambulatory Visit: Payer: Self-pay | Admitting: Internal Medicine

## 2016-03-21 DIAGNOSIS — K529 Noninfective gastroenteritis and colitis, unspecified: Secondary | ICD-10-CM

## 2016-03-21 NOTE — Telephone Encounter (Signed)
Yes, I would have her d/c until follow-up  We can discuss restarting it at this visit May need to be worked in if [place] not available

## 2016-03-21 NOTE — Telephone Encounter (Signed)
Dr Hilarie Fredrickson- Patient requests refills of Viberzi. She has had cholecystectomy. Please advise... Would you like me to have her d/c until office visit?

## 2016-03-21 NOTE — Telephone Encounter (Signed)
Left voicemail for patient to call back. 

## 2016-03-21 NOTE — Telephone Encounter (Signed)
I have spoken to patient to advise her of Dr Vena Rua request for her to discontinue Viberzi until office visit. She will be scheduled with either Dr Hilarie Fredrickson or a PA while Dr Hilarie Fredrickson is in clinic. She verbalizes understanding.

## 2016-04-06 ENCOUNTER — Encounter: Payer: Self-pay | Admitting: *Deleted

## 2016-04-18 ENCOUNTER — Ambulatory Visit (INDEPENDENT_AMBULATORY_CARE_PROVIDER_SITE_OTHER): Payer: Medicare Other | Admitting: Internal Medicine

## 2016-04-18 ENCOUNTER — Encounter: Payer: Self-pay | Admitting: Internal Medicine

## 2016-04-18 VITALS — BP 136/80 | HR 84 | Ht 65.0 in | Wt 228.0 lb

## 2016-04-18 DIAGNOSIS — Z86010 Personal history of colonic polyps: Secondary | ICD-10-CM

## 2016-04-18 DIAGNOSIS — K2100 Gastro-esophageal reflux disease with esophagitis, without bleeding: Secondary | ICD-10-CM

## 2016-04-18 DIAGNOSIS — K580 Irritable bowel syndrome with diarrhea: Secondary | ICD-10-CM | POA: Diagnosis not present

## 2016-04-18 MED ORDER — DIPHENOXYLATE-ATROPINE 2.5-0.025 MG PO TABS: 2.0000 | ORAL_TABLET | Freq: Four times a day (QID) | ORAL | 2 refills | Status: DC

## 2016-04-18 NOTE — Progress Notes (Signed)
Subjective:    Patient ID: [name], female    DOB: 10-15-1960, 55 y.o.   MRN: [id number]  HPI [name] is a 55 year old female with a history of chronic diarrhea with IBS, adenomatous colon polyps and GERD who is here for follow-up. She also has a history of fibromyalgia, migraines, sleep apnea, anxiety and hyperlipidemia. She is here alone today. She was last seen in the office in February 2017, but came for colonoscopy on 11/01/2015. Colonoscopy revealed sessile cecal polyp removed, sessile ascending colon polyp removed, 3 transverse colon polyps removed, 2 sigmoid polyps removed, and otherwise normal-appearing colonic mucosa. EGD on the same day revealed moderately severe reflux esophagitis, mild antral gastropathy and moderate duodenal inflammation. Biopsies were performed. Pathology revealed mild duodenitis consistent with peptic injury, reactive gastropathy negative for H. pylori, unremarkable squamous mucosa in the esophagus. 6 tubular adenomas and a benign lymphoid polyp. Unremarkable colonic mucosa without microscopic colitis active inflammation or granulomas. She was started on Viberzi 75 mg twice a day after this procedure for IBS-D.  Today she returns to discuss chronic diarrhea from IBS. We did not renew Viberzi based on the new contraindication for this medication in patients with prior cholecystectomy. She reports that she had a dramatic life-changing response to Viberzi. Her diarrhea had completely resolved as had her abdominal pain, nausea and vomiting. Her diarrhea as well as intermittent nausea with vomiting have returned after stopping Viberzi. She had previous nonresponse to Imodium. She's been using Lomotil 1 tablet 4 times a day while waiting on this appointment. It has helped but she has continued to have bowel movements 3-5 times per day, worse after eating. No blood in her stool or melena. She also previous he tried cholestyramine  Review of Systems As per history of  present illness, otherwise negative  Current Medications, Allergies, Past Medical History, Past Surgical History, Family History and Social History were reviewed in [employer] record.     Objective:   Physical Exam BP 136/80 (BP Location: Right Arm, Patient Position: Sitting, Cuff Size: Normal)   Pulse 84   Ht 5\' 5"  (1.651 m)   Wt 228 lb (103.4 kg)   BMI 37.94 kg/m  Constitutional: Well-developed and well-nourished. No distress. HEENT: Normocephalic and atraumatic.  Conjunctivae are normal.  No scleral icterus. Neck: Neck supple. Trachea midline. Cardiovascular: Normal rate, regular rhythm and intact distal pulses.  Pulmonary/chest: Effort normal and breath sounds normal. No wheezing, rales or rhonchi. Abdominal: Soft, nontender, nondistended. Bowel sounds active throughout. Extremities: no clubbing, cyanosis, or edema Neurological: Alert and oriented to person place and time. Skin: Skin is warm and dry.  Psychiatric: Normal mood and affect. Behavior is normal.      Assessment & Plan:  55 year old female with a history of chronic diarrhea with IBS, adenomatous colon polyps and GERD who is here for follow-up.  1. IBS-D -- Severe irritable bowel with diarrhea predominance. Excellent response to Viberzi but due to the risk of sphincter of oddi spasm and resultant pancreatitis in patients with prior cholecystectomy, I recommended that we discontinue this medication. She is in agreement. We discussed other options and will dose titrate Lomotil to 2 tablets 4 times a day. She will try this for 2-4 weeks and if ineffective I have recommended Lotronex.    She has read through the medication information packet, read through and signed the patient acknowledgement form.  We discussed at length the risks, though rare, of severe constipation and even ischemic colitis. I have  instructed that if we start this medication she would need to notify me immediately should she  develop constipation, change in abdominal pain, worsening abdominal pain, rectal bleeding. She voiced understanding. If we start this we would try 0.5 mg twice a day to start and titrate as needed.  2. Adenomatous colon polyps -- repeat colonoscopy in 3 years, March 2020  3. GERD with esophagitis -- continue pantoprazole 40 mg daily. Reflux and heartburn are now much better controlled symptomatically.  25 minutes spent with the patient today. Greater than 50% was spent in counseling and coordination of care with the patient

## 2016-04-18 NOTE — Patient Instructions (Signed)
We have sent the following medications to your pharmacy for you to pick up at your convenience: Lomotil 2 tablets four times daily (increase from current dosage)  Discontinue Viberzi.  Call our office in 3-4 weeks if your diarrhea is no better on the increase in lomotil. If no better, we may start you on Lotronex.  If you are age 55 or older, your body mass index should be between 23-30. Your Body mass index is 37.94 kg/m. If this is out of the aforementioned range listed, please consider follow up with your Primary Care Provider.  If you are age 73 or younger, your body mass index should be between 19-25. Your Body mass index is 37.94 kg/m. If this is out of the aformentioned range listed, please consider follow up with your Primary Care Provider.

## 2016-05-12 ENCOUNTER — Telehealth: Payer: Self-pay | Admitting: Internal Medicine

## 2016-05-12 NOTE — Telephone Encounter (Signed)
Noted  

## 2016-05-31 ENCOUNTER — Ambulatory Visit: Payer: Medicare Other | Admitting: Internal Medicine

## 2016-06-09 ENCOUNTER — Other Ambulatory Visit: Payer: Self-pay | Admitting: Family Medicine

## 2016-06-09 DIAGNOSIS — Z1231 Encounter for screening mammogram for malignant neoplasm of breast: Secondary | ICD-10-CM

## 2016-06-14 ENCOUNTER — Ambulatory Visit: Payer: Self-pay

## 2016-06-15 ENCOUNTER — Ambulatory Visit: Payer: Self-pay

## 2016-06-19 ENCOUNTER — Ambulatory Visit
Admission: RE | Admit: 2016-06-19 | Discharge: 2016-06-19 | Disposition: A | Discharge status: Home / Self Care | Payer: Medicare Other | Source: Ambulatory Visit | Attending: Family Medicine | Admitting: Family Medicine

## 2016-06-19 DIAGNOSIS — Z1231 Encounter for screening mammogram for malignant neoplasm of breast: Secondary | ICD-10-CM

## 2016-08-07 ENCOUNTER — Other Ambulatory Visit: Payer: Self-pay | Admitting: Internal Medicine

## 2016-10-04 ENCOUNTER — Institutional Professional Consult (permissible substitution): Payer: Self-pay | Admitting: Pulmonary Disease

## 2016-11-02 ENCOUNTER — Other Ambulatory Visit: Payer: Self-pay | Admitting: Internal Medicine

## 2016-12-05 ENCOUNTER — Institutional Professional Consult (permissible substitution): Payer: Medicare Other | Admitting: Pulmonary Disease

## 2016-12-29 ENCOUNTER — Institutional Professional Consult (permissible substitution): Payer: Medicare Other | Admitting: Pulmonary Disease

## 2017-01-12 ENCOUNTER — Other Ambulatory Visit: Payer: Self-pay | Admitting: Internal Medicine

## 2017-01-20 ENCOUNTER — Other Ambulatory Visit: Payer: Self-pay | Admitting: Internal Medicine

## 2017-01-20 DIAGNOSIS — K219 Gastro-esophageal reflux disease without esophagitis: Secondary | ICD-10-CM

## 2017-01-23 ENCOUNTER — Other Ambulatory Visit: Payer: Self-pay | Admitting: Internal Medicine

## 2017-01-23 DIAGNOSIS — K219 Gastro-esophageal reflux disease without esophagitis: Secondary | ICD-10-CM

## 2017-02-27 ENCOUNTER — Other Ambulatory Visit: Payer: Self-pay | Admitting: Internal Medicine

## 2017-03-01 ENCOUNTER — Other Ambulatory Visit: Payer: Self-pay | Admitting: *Deleted

## 2017-03-01 MED ORDER — DIPHENOXYLATE-ATROPINE 2.5-0.025 MG PO TABS: 2.0000 | ORAL_TABLET | Freq: Four times a day (QID) | ORAL | 0 refills | Status: DC

## 2017-03-06 ENCOUNTER — Institutional Professional Consult (permissible substitution): Payer: Medicare Other | Admitting: Pulmonary Disease

## 2017-03-08 ENCOUNTER — Ambulatory Visit (INDEPENDENT_AMBULATORY_CARE_PROVIDER_SITE_OTHER): Payer: Medicare Other | Admitting: Internal Medicine

## 2017-03-08 ENCOUNTER — Encounter: Payer: Self-pay | Admitting: Internal Medicine

## 2017-03-08 VITALS — BP 140/64 | HR 107 | Ht 66.0 in | Wt 225.0 lb

## 2017-03-08 DIAGNOSIS — R251 Tremor, unspecified: Secondary | ICD-10-CM | POA: Diagnosis not present

## 2017-03-08 DIAGNOSIS — G4733 Obstructive sleep apnea (adult) (pediatric): Secondary | ICD-10-CM | POA: Diagnosis not present

## 2017-03-08 DIAGNOSIS — M797 Fibromyalgia: Secondary | ICD-10-CM | POA: Diagnosis not present

## 2017-03-08 DIAGNOSIS — R002 Palpitations: Secondary | ICD-10-CM | POA: Diagnosis not present

## 2017-03-08 DIAGNOSIS — G4761 Periodic limb movement disorder: Secondary | ICD-10-CM | POA: Diagnosis not present

## 2017-03-08 NOTE — Patient Instructions (Addendum)
Order- Schedule split protocol NPSG    Dx OSA, limb movement sleep disorder, palpitations  Please call as needed

## 2017-03-08 NOTE — Assessment & Plan Note (Signed)
She qualified for this diagnosis and for CPAP therapy with a sleep study 3 years ago in [place] state. She wants an updated sleep study to clarify. Her symptoms today are more consistent with poor sleep hygiene and insomnia, irregular sleep habits and anxiety. She seems to be describing considerable body movement while asleep and also palpitations, so we are ordering an unattended overnight sleep study.

## 2017-03-08 NOTE — Assessment & Plan Note (Signed)
She reports disability based on her diagnosis of fibromyalgia

## 2017-03-08 NOTE — Progress Notes (Signed)
03/08/17- 56 year old female never smoker Pt referred by Clyde Lundborg, PAC at [facility]. Sleep study around 2008 In [place]. Owns a CPAP but has not used it since 2011. Does not have a DME. Epworth Score: 0 Medical problems include anxiety and depression, asthma, fibromyalgia, GERD, IBS She lives alone with her dog. She considers her primary sleep problem to be insomnia with difficulty initiating and maintaining sleep. If she takes 2 hydroxyzine's at bedtime "it knocks me out". Alternatively dosed 1, 3 times daily, does not help her sleep. Ambien has helped sometimes in the past. Bedtime is irregular. She indicates sometimes she doesn't sleep for more than one day. Wakes several times during the night. Gets up irregularly. Occasional naps. No caffeine. Admits tremor especially when stressed, possibly more complex limb movement during sleep. Palpitations. Sleep study in 2008 in [place] state, then treated for 3 years with CPAP until she began having more trouble with fullface mask and stopped using it. Says she drools in her sleep. Has gained 25 pounds in the last 2 years. No ENT surgery or heart disease. Treated for asthma and GERD. Blames fibromyalgia for some of her discomforts. Admits stress and anxiety but says she can't afford a psychiatrist. Disabled because of fibromyalgia. Previously was a truck drivers, International aid/development worker, Dentist. Patient endorses Epworth score 0/24  Prior to Admission medications   Medication Sig Start Date End Date Taking? Authorizing Provider  acetaminophen (TYLENOL) 500 MG tablet Take 500 mg by mouth every 6 (six) hours as needed for pain.   Yes [provider]  clonazePAM (KLONOPIN) 0.5 MG tablet Take by mouth 3 (three) times daily. 06/08/13  Yes [provider]  diphenoxylate-atropine (LOMOTIL) 2.5-0.025 MG tablet Take 2 tablets by mouth 4 (four) times daily. 03/01/17  Yes Pyrtle, Lajuan Lines, MD  hydrOXYzine (VISTARIL) 25 MG  capsule Take 1 capsule by mouth daily. 01/22/13  Yes [provider]  ipratropium (ATROVENT HFA) 17 MCG/ACT inhaler Inhale 2 puffs into the lungs every 6 (six) hours.   Yes [provider]  montelukast (SINGULAIR) 10 MG tablet Take 10 mg by mouth daily. 01/30/17  Yes [provider]  ondansetron (ZOFRAN) 4 MG tablet Take 4 mg by mouth as needed. 01/30/17  Yes [provider]  pantoprazole (PROTONIX) 40 MG tablet TAKE 1 TABLET BY MOUTH 30 MINUTES BEFORE BREAKFAST AND 30 MINUTES BEFORE SUPPER 01/23/17  Yes Pyrtle, Lajuan Lines, MD  rosuvastatin (CRESTOR) 10 MG tablet Take 1 tablet by mouth daily. 01/30/17  Yes [provider]  SUMAtriptan (IMITREX) 25 MG tablet Take 25 mg by mouth every 2 (two) hours as needed for migraine or headache. May repeat in 2 hours if headache persists or recurs.   Yes [provider]  venlafaxine XR (EFFEXOR-XR) 75 MG 24 hr capsule Take 1 capsule by mouth daily. 01/22/13  Yes [provider]   Past Medical History:  Diagnosis Date  . Adenomatous colon polyp   . Anxiety   . Anxiety and depression   . Asthma   . Family history of anesthesia complication    mom was slow in awakening  . Fibromyalgia   . GERD (gastroesophageal reflux disease)   . Hyperlipidemia   . IBS (irritable bowel syndrome)   . Migraine   . Palpitations    assoc w anxiety  . Shortness of breath    w/ activities and sometimes when lying flat  . Sleep apnea    uses cpap  . Tubular adenoma of  colon    Past Surgical History:  Procedure Laterality Date  . CHOLECYSTECTOMY  1989  . ECTOPIC PREGNANCY SURGERY    . ELBOW SURGERY    . ESOPHAGOGASTRODUODENOSCOPY N/A 07/10/2013   Procedure: ESOPHAGOGASTRODUODENOSCOPY (EGD);  Surgeon: Gatha Mayer, MD;  Location: [facility];  Service: Endoscopy;  Laterality: N/A;  . HERNIA REPAIR    . KNEE ARTHROSCOPY WITH LATERAL MENISECTOMY Right 02/20/2013   Procedure: RIGHT KNEE ARTHROSCOPY WITH MEDIAL AND LATERAL  MENISECTOMY, Abrasion Chondroplasty of medial and lateral femoral condyle, microfracture of medial and lateral condyle, synovectomy of supra patella pouch;  Surgeon: Tobi Bastos, MD;  Location: [facility];  Service: Orthopedics;  Laterality: Right;  . KNEE SURGERY    . VAGINAL HYSTERECTOMY     Family History  Problem Relation Age of Onset  . Diabetes Mother   . Heart attack Father   . Multiple sclerosis Sister   . Kidney failure Sister   . Depression Unknown   . Diabetes Unknown   . Heart attack Unknown   . Breast cancer Paternal Grandmother   . Colon cancer Neg Hx   . Esophageal cancer Neg Hx   . Stomach cancer Neg Hx    Social History   Social History  . Marital status: Divorced    Spouse name: N/A  . Number of children: 1  . Years of education: 12th   Occupational History  . N/A    Social History Main Topics  . Smoking status: Never Smoker  . Smokeless tobacco: Never Used  . Alcohol use Yes     Comment: occasionally  . Drug use: No  . Sexual activity: Not on file   Other Topics Concern  . Not on file   Social History Narrative   Pt lives at home alone.   Caffeine Use: none; quit years ago   ROS-see HPI   + = pos Constitutional:    weight loss, night sweats, fevers, chills, fatigue, lassitude. HEENT:    + headaches, difficulty swallowing, tooth/dental problems, sore throat,       sneezing, itching, ear ache, + nasal congestion, post nasal drip, snoring CV:    chest pain, orthopnea, PND, swelling in lower extremities, anasarca,                                                          dizziness, + palpitations Resp:   + shortness of breath with exertion or at rest.                productive cough,   non-productive cough, coughing up of blood.              change in color of mucus.  wheezing.   Skin:    rash or lesions. GI:  + heartburn, indigestion, abdominal pain, nausea, vomiting, diarrhea,                 change in bowel habits, loss of  appetite GU: dysuria, change in color of urine, no urgency or frequency.   flank pain. MS:  + joint pain, stiffness, decreased range of motion, back pain. Neuro-     nothing unusual Psych:  change in mood or affect.  + depression or anxiety.   memory loss.  OBJ- Physical Exam General- Alert, Oriented, Affect +  anxious, Distress- none acute, + obese Skin- rash-none, lesions- none, excoriation- none Lymphadenopathy- none Head- atraumatic            Eyes- Gross vision intact, PERRLA, conjunctivae and secretions clear            Ears- Hearing, canals-normal            Nose- Clear, no-Septal dev, mucus, polyps, erosion, perforation             Throat- Mallampati III , mucosa clear , drainage- none, tonsils- atrophic, + extensive dental repair, + strained vocal quality Neck- flexible , trachea midline, no stridor , thyroid nl, carotid no bruit Chest - symmetrical excursion , unlabored           Heart/CV- RRR , no murmur , no gallop  , no rub, nl s1 s2                           - JVD- none , edema- none, stasis changes- none, varices- none           Lung- clear to P&A, wheeze- none, cough- none , dullness-none, rub- none           Chest wall-  Abd-  Br/ Gen/ Rectal- Not done, not indicated Extrem- cyanosis- none, clubbing, none, atrophy- none, strength- nl Neuro- + seemed quite stressed-tremor and rocking/distractible

## 2017-03-08 NOTE — Assessment & Plan Note (Signed)
Her tremor and hoarseness seemed to increase with attention and decrease with distraction, suggesting a significant stress component. She says she has not been able to afford psychiatric evaluation.

## 2017-04-05 ENCOUNTER — Ambulatory Visit: Payer: Medicare Other | Attending: Internal Medicine | Admitting: Internal Medicine

## 2017-04-05 DIAGNOSIS — G4733 Obstructive sleep apnea (adult) (pediatric): Secondary | ICD-10-CM | POA: Diagnosis not present

## 2017-04-05 DIAGNOSIS — G4761 Periodic limb movement disorder: Secondary | ICD-10-CM

## 2017-04-05 DIAGNOSIS — R002 Palpitations: Secondary | ICD-10-CM | POA: Diagnosis present

## 2017-04-11 ENCOUNTER — Other Ambulatory Visit: Payer: Self-pay | Admitting: Family Medicine

## 2017-04-11 DIAGNOSIS — Z1231 Encounter for screening mammogram for malignant neoplasm of breast: Secondary | ICD-10-CM

## 2017-04-22 NOTE — Procedures (Signed)
  Patient Name: [name], [name] Date: 04/05/2017 Gender: Female D.O.B: [birth year] Age (years): 58 Referring Provider: Baird Lyons MD, ABSM Height (inches): 66 Interpreting Physician: Baird Lyons MD, ABSM Weight (lbs): 225 RPSGT: Peak, Robert BMI: 36 MRN: [id number] Neck Size: 16.50 CLINICAL INFORMATION Sleep Study Type: NPSG  Indication for sleep study: OSA  Epworth Sleepiness Score: 14   SLEEP STUDY TECHNIQUE As per the AASM Manual for the Scoring of Sleep and Associated Events v2.3 (April 2016) with a hypopnea requiring 4% desaturations.  The channels recorded and monitored were frontal, central and occipital EEG, electrooculogram (EOG), submentalis EMG (chin), nasal and oral airflow, thoracic and abdominal wall motion, anterior tibialis EMG, snore microphone, electrocardiogram, and pulse oximetry.  MEDICATIONS Medications self-administered by patient taken the night of the study : VENLAFAXINE, CLONAZEPAM, HYDROXYZINE, DIPHENOXYLATE HCL & ATROPINE SULFATE, ACETAMINOPHEN PM  SLEEP ARCHITECTURE The study was initiated at 10:50:27 PM and ended at 5:04:49 AM.  Sleep onset time was 8.7 minutes and the sleep efficiency was 85.1%. The total sleep time was 318.7 minutes.  Stage REM latency was N/A minutes.  The patient spent 10.26% of the night in stage N1 sleep, 83.62% in stage N2 sleep, 6.12% in stage N3 and 0.00% in REM.  Alpha intrusion was absent.  Supine sleep was 100.00%.  RESPIRATORY PARAMETERS The overall apnea/hypopnea index (AHI) was 7.3 per hour. There were 11 total apneas, including 0 obstructive, 11 central and 0 mixed apneas. There were 28 hypopneas and 34 RERAs.  The AHI during Stage REM sleep was N/A per hour.  AHI while supine was 7.3 per hour.  The mean oxygen saturation was 94.03%. The minimum SpO2 during sleep was 87.00%.  Loud snoring was noted during this study.  CARDIAC DATA The 2 lead EKG demonstrated sinus rhythm. The mean heart  rate was 74.34 beats per minute. Other EKG findings include: None.  LEG MOVEMENT DATA The total PLMS were 674 with a resulting PLMS index of 126.89. Associated arousal with leg movement index was 3.6 .  IMPRESSIONS - Mild obstructive sleep apnea occurred during this study (AHI = 7.3/h). - No significant central sleep apnea occurred during this study (CAI = 2.1/h). - Mild oxygen desaturation was noted during this study (Min O2 = 87.00%, Mean 94%). - The patient snored with Loud snoring volume. - No cardiac abnormalities were noted during this study. - Severe periodic limb movements of sleep occurred during the study. No significant associated arousals.  DIAGNOSIS - Obstructive Sleep Apnea (327.23 [G47.33 ICD-10]) - Periodic Limb Movement Syndrome (327.51 [G47.61 ICD-10]) - Primary Snoring (786.09 [R06.83 ICD-10])  RECOMMENDATIONS - Very mild obstructive sleep apnea. Treatment options will be based on clinical judgment. - Consider specific therapy for  limb movement sleep disorder. - Be careful with alcohol, sedatives and other CNS depressants that may worsen sleep apnea and disrupt normal sleep architecture. - Sleep hygiene should be reviewed to assess factors that may improve sleep quality. - Weight management and regular exercise should be initiated or continued if appropriate.  [Electronically signed] 04/22/2017 09:53 AM  Baird Lyons MD, ABSM Diplomate, American Board of Sleep Medicine   NPI: [id number]  [place], American Board of Sleep Medicine  ELECTRONICALLY SIGNED ON:  04/22/2017, 9:47 AM [place] PH: [phone]   FX: [phone] [place]

## 2017-06-20 ENCOUNTER — Ambulatory Visit: Payer: Self-pay

## 2017-06-21 ENCOUNTER — Telehealth: Payer: Self-pay | Admitting: Internal Medicine

## 2017-06-22 MED ORDER — DIPHENOXYLATE-ATROPINE 2.5-0.025 MG PO TABS: 2.0000 | ORAL_TABLET | Freq: Four times a day (QID) | ORAL | 0 refills | Status: DC

## 2017-06-22 NOTE — Telephone Encounter (Signed)
Left voicemail for patient that she needs an office visit. She has not been seen in over a year and was to communicate with [place] 3-4 weeks after her last visit 03/2016. I can schedule her for visit no 06/26/17. I am glad to refill her lomotil until that visit.

## 2017-06-22 NOTE — Telephone Encounter (Signed)
Patient states she will come for appointment 06/26/17. She needs rx sent to [facility] right now as she is completely out of rx. Rx sent for her.

## 2017-06-22 NOTE — Addendum Note (Signed)
Addended by: Larina Bras on: 06/22/2017 03:36 PM   Modules accepted: Orders

## 2017-06-26 ENCOUNTER — Encounter: Payer: Self-pay | Admitting: Internal Medicine

## 2017-06-26 ENCOUNTER — Ambulatory Visit (INDEPENDENT_AMBULATORY_CARE_PROVIDER_SITE_OTHER): Payer: Medicare Other | Admitting: Internal Medicine

## 2017-06-26 VITALS — BP 122/84 | HR 74 | Ht 65.5 in | Wt 225.5 lb

## 2017-06-26 DIAGNOSIS — K219 Gastro-esophageal reflux disease without esophagitis: Secondary | ICD-10-CM

## 2017-06-26 DIAGNOSIS — K580 Irritable bowel syndrome with diarrhea: Secondary | ICD-10-CM

## 2017-06-26 DIAGNOSIS — Z86010 Personal history of colonic polyps: Secondary | ICD-10-CM | POA: Diagnosis not present

## 2017-06-26 MED ORDER — DIPHENOXYLATE-ATROPINE 2.5-0.025 MG PO TABS: 2.0000 | ORAL_TABLET | Freq: Four times a day (QID) | ORAL | 6 refills | Status: DC

## 2017-06-26 MED ORDER — PANTOPRAZOLE SODIUM 40 MG PO TBEC: 40.0000 mg | DELAYED_RELEASE_TABLET | Freq: Two times a day (BID) | ORAL | 6 refills | Status: AC

## 2017-06-26 NOTE — Progress Notes (Signed)
   Subjective:    Patient ID: [name], female    DOB: 1961/02/13, 56 y.o.   MRN: [id number]  HPI [name] is a 56 year old female with a history of chronic IBS with diarrhea, adenomatous colon polyps and GERD who is here for follow-up. She is here alone today and was last seen on 04/18/2016. She reports that her diarrhea has been extremely well controlled with Lomotil. She's been using 2 tablets 4 times a day. She will occasionally develop constipation and so she will reduce Lomotil or skip a dose on occasion when she feels constipated. If she stops Lomotil altogether the diarrhea returns promptly. No blood in her stool or melena. She previously was given Viberzi which worked well but was stopped due to her history of cholecystectomy and concern for pancreatitis. She did not develop pancreatitis on this medication.  Her reflux is been well controlled and she denies dysphagia and odynophagia. She is using pantoprazole 40 mg twice a day. She has not needed Zofran and has had very rare nausea recently. No vomiting.  Her last colonoscopy was on 11/01/2015 where she had multiple tubular adenomas removed, all less than a centimeter.  Review of Systems As per history of present illness, otherwise negative  Current Medications, Allergies, Past Medical History, Past Surgical History, Family History and Social History were reviewed in [employer] record.     Objective:   Physical Exam BP 122/84   Pulse 74   Ht 5' 5.5" (1.664 m)   Wt 225 lb 8 oz (102.3 kg)   BMI 36.95 kg/m  Constitutional: Well-developed and well-nourished. No distress. HEENT: Normocephalic and atraumatic.Conjunctivae are normal.  No scleral icterus. Neck: Neck supple. Trachea midline. Cardiovascular: Normal rate, regular rhythm and intact distal pulses. Pulmonary/chest: Effort normal and breath sounds normal. No wheezing, rales or rhonchi. Abdominal: Soft, nontender, nondistended. Bowel sounds  active throughout.  Extremities: no clubbing, cyanosis, or edema Neurological: Alert and oriented to person place and time. Skin: Skin is warm and dry. Psychiatric: Normal mood and affect. Behavior is normal.      Assessment & Plan:  56 year old female with a history of chronic IBS with diarrhea, adenomatous colon polyps and GERD who is here for follow-up.  1. IBS-D -- diarrhea predominant irritable bowel. Symptoms well-controlled with Lomotil. She's having some constipation on occasion and so I recommended that she reduce her dose slightly. She can alternate 1 tablet with 2 tablets every 6 hours as needed. She is happy with this plan.  2. History of adenomatous colon polyps -- surveillance colonoscopy recommended March 2020  3. GERD with history of esophagitis -- continue pantoprazole 40 mg once to twice daily.  Can be provided refills until her next surveillance colonoscopy in March 2020, follow-up as needed before this  15 minutes spent with the patient today. Greater than 50% was spent in counseling and coordination of care with the patient

## 2017-06-26 NOTE — Patient Instructions (Signed)
If you are age 56 or older, your body mass index should be between 23-30. Your Body mass index is 36.95 kg/m. If this is out of the aforementioned range listed, please consider follow up with your Primary Care Provider.  If you are age 82 or younger, your body mass index should be between 19-25. Your Body mass index is 36.95 kg/m. If this is out of the aformentioned range listed, please consider follow up with your Primary Care Provider.   We have sent the following medications to your pharmacy for you to pick up at your convenience:  Pantoprazole  Lomotil  You will be due for a recall colonoscopy in March of 2020. We will send you a reminder in the mail when it gets closer to that time.  Thank you.

## 2017-06-27 MED ORDER — DIPHENOXYLATE-ATROPINE 2.5-0.025 MG PO TABS: 2.0000 | ORAL_TABLET | Freq: Four times a day (QID) | ORAL | 3 refills | Status: DC

## 2017-06-27 NOTE — Addendum Note (Signed)
Addended by: Larina Bras on: 06/27/2017 04:08 PM   Modules accepted: Orders

## 2017-07-09 ENCOUNTER — Ambulatory Visit: Payer: Medicare Other | Admitting: Internal Medicine

## 2017-07-09 ENCOUNTER — Encounter: Payer: Self-pay | Admitting: Internal Medicine

## 2017-07-09 DIAGNOSIS — G4761 Periodic limb movement disorder: Secondary | ICD-10-CM

## 2017-07-09 DIAGNOSIS — G4733 Obstructive sleep apnea (adult) (pediatric): Secondary | ICD-10-CM | POA: Diagnosis not present

## 2017-07-09 DIAGNOSIS — F5101 Primary insomnia: Secondary | ICD-10-CM | POA: Diagnosis not present

## 2017-07-09 DIAGNOSIS — J4530 Mild persistent asthma, uncomplicated: Secondary | ICD-10-CM

## 2017-07-09 MED ORDER — FLUTICASONE FUROATE-VILANTEROL 100-25 MCG/INH IN AEPB: 1.0000 | INHALATION_SPRAY | Freq: Every day | RESPIRATORY_TRACT | 0 refills | Status: AC

## 2017-07-09 MED ORDER — ZOLPIDEM TARTRATE 5 MG PO TABS: ORAL_TABLET | ORAL | 0 refills | Status: AC

## 2017-07-09 NOTE — Progress Notes (Signed)
03/08/17- 56 year old female never smoker Pt referred by Clyde Lundborg, PAC at [facility]. Sleep study around 2008 In [place]. Owns a CPAP but has not used it since 2011. Does not have a DME. Epworth Score: 0 Medical problems include anxiety and depression, asthma, fibromyalgia, GERD, IBS She lives alone with her dog. She considers her primary sleep problem to be insomnia with difficulty initiating and maintaining sleep. If she takes 2 hydroxyzine's at bedtime "it knocks me out". Alternatively dosed 1, 3 times daily, does not help her sleep. Ambien has helped sometimes in the past. Bedtime is irregular. She indicates sometimes she doesn't sleep for more than one day. Wakes several times during the night. Gets up irregularly. Occasional naps. No caffeine. Admits tremor especially when stressed, possibly more complex limb movement during sleep. Palpitations. Sleep study in 2008 in [place] state, then treated for 3 years with CPAP until she began having more trouble with fullface mask and stopped using it. Says she drools in her sleep. Has gained 25 pounds in the last 2 years. No ENT surgery or heart disease. Treated for asthma and GERD. Blames fibromyalgia for some of her discomforts. Admits stress and anxiety but says she can't afford a psychiatrist. Disabled because of fibromyalgia. Previously was a truck drivers, International aid/development worker, Dentist. Patient endorses Epworth score 0/24  07/09/17-56 year old female never smoker followed for OSA, insomnia, complicated by anxiety/depression, asthma, fibromyalgia, GERD, IBS NPSG 04/05/17- AHI 7.3/ hr, desat to 87%, loud snoring, PLM severe with arousals, body weight 225 lbs. Her primary complaint is difficulty initiating and maintaining sleep.  Lives alone.  No sleep walking.  She does not seem to be very aware of the active limb movement sleep disturbance recorded on her study.  She continues Effexor each morning and 10 PM, clonazepam  each morning at 5 PM.  Says clonazepam does not help very much with the insomnia aspect. She admits to feeling fearful, anxious. She has history of asthma and admitted dry cough/chest tightness has been persistent recently.  Uses rescue inhaler some with no maintenance therapy.  ROS-see HPI   + = pos Constitutional:    weight loss, night sweats, fevers, chills, fatigue, lassitude. HEENT:    + headaches, difficulty swallowing, tooth/dental problems, sore throat,       sneezing, itching, ear ache, + nasal congestion, post nasal drip, snoring CV:    chest pain, orthopnea, PND, swelling in lower extremities, anasarca,                                                          dizziness, + palpitations Resp:   + shortness of breath with exertion or at rest.                productive cough,   +non-productive cough, coughing up of blood.              change in color of mucus.  wheezing.   Skin:    rash or lesions. GI:  + heartburn, indigestion, abdominal pain, nausea, vomiting, diarrhea,                 change in bowel habits, loss of appetite GU: dysuria, change in color of urine, no urgency or frequency.   flank pain. MS:  + joint pain, stiffness, decreased range  of motion, back pain. Neuro-     nothing unusual Psych:  change in mood or affect.  + depression or anxiety.   memory loss.  OBJ- Physical Exam General- Alert, Oriented, Affect + anxious, Distress- none acute, + obese Skin- rash-none, lesions- none, excoriation- none Lymphadenopathy- none Head- atraumatic            Eyes- Gross vision intact, PERRLA, conjunctivae and secretions clear            Ears- Hearing, canals-normal            Nose-+ sniffing, no-Septal dev, mucus, polyps, erosion, perforation             Throat- Mallampati III , mucosa clear , drainage- none, tonsils- atrophic, + extensive dental repair, + strained vocal quality Neck- flexible , trachea midline, no stridor , thyroid nl, carotid no bruit Chest - symmetrical  excursion , unlabored           Heart/CV- RRR , no murmur , no gallop  , no rub, nl s1 s2                           - JVD- none , edema- none, stasis changes- none, varices- none           Lung- clear to P&A, wheeze- none, cough+ , dullness-none, rub- none           Chest wall-  Abd-  Br/ Gen/ Rectal- Not done, not indicated Extrem- cyanosis- none, clubbing, none, atrophy- none, strength- nl Neuro- + seemed quite stressed-tremor and rocking/distractible

## 2017-07-09 NOTE — Patient Instructions (Signed)
Script for [employer] 5 mg   Try 1 or 2 at bedtime for sleep. If it works well, call for script.  Sample Breo 100  Maintenance inhaler   Inhale 1 puff, then rinse mouth, once daily> Call for script if it helps with the asthma. You can still use the Atrovent rescue inhaler if needed.  Please call as needed

## 2017-07-11 DIAGNOSIS — F5101 Primary insomnia: Secondary | ICD-10-CM | POA: Insufficient documentation

## 2017-07-11 DIAGNOSIS — J4530 Mild persistent asthma, uncomplicated: Secondary | ICD-10-CM | POA: Insufficient documentation

## 2017-07-11 DIAGNOSIS — G4761 Periodic limb movement disorder: Secondary | ICD-10-CM | POA: Insufficient documentation

## 2017-07-11 NOTE — Assessment & Plan Note (Signed)
Very mild sleep apnea.  Talking with her, I suspect CPAP would be more intrusive and disruptive than it is worth.  We did discuss oral appliances if needed.  We are going to focus on the insomnia component first and see what she notices.

## 2017-07-11 NOTE — Assessment & Plan Note (Signed)
When I noted that she was coughing she admitted some chest tightness and seem pleased that I could offer anything to help. Plan- sample Breo for trial

## 2017-07-11 NOTE — Assessment & Plan Note (Signed)
It may eventually work better to increase clonazepam for sleep at bedtime since this may also help settle limb movement, but she is not aware of the limb movement.  We are going to see how she responds to Ambien.  Limb movement may be increased by her Effexor.

## 2017-07-11 NOTE — Assessment & Plan Note (Signed)
Not clear if her mild sleep apnea contributes significantly to difficulty maintaining sleep.  We discussed basic sleep hygiene. Plan-instead of increasing clonazepam at bedtime, we are going to let her try Ambien 5 mg, 1 or 2 tabs for sleep as needed

## 2017-07-13 ENCOUNTER — Ambulatory Visit
Admission: RE | Admit: 2017-07-13 | Discharge: 2017-07-13 | Disposition: A | Discharge status: Home / Self Care | Payer: Medicare Other | Source: Ambulatory Visit | Attending: Family Medicine | Admitting: Family Medicine

## 2017-07-13 DIAGNOSIS — Z1231 Encounter for screening mammogram for malignant neoplasm of breast: Secondary | ICD-10-CM

## 2017-10-16 ENCOUNTER — Ambulatory Visit: Payer: Self-pay | Admitting: Internal Medicine

## 2018-02-11 ENCOUNTER — Other Ambulatory Visit: Payer: Self-pay | Admitting: Internal Medicine

## 2018-06-14 ENCOUNTER — Other Ambulatory Visit: Payer: Self-pay | Admitting: Family Medicine

## 2018-06-14 DIAGNOSIS — Z1231 Encounter for screening mammogram for malignant neoplasm of breast: Secondary | ICD-10-CM

## 2018-07-23 ENCOUNTER — Ambulatory Visit
Admission: RE | Admit: 2018-07-23 | Discharge: 2018-07-23 | Disposition: A | Discharge status: Home / Self Care | Payer: Medicare Other | Source: Ambulatory Visit | Attending: Family Medicine | Admitting: Family Medicine

## 2018-07-23 DIAGNOSIS — Z1231 Encounter for screening mammogram for malignant neoplasm of breast: Secondary | ICD-10-CM

## 2018-07-29 ENCOUNTER — Other Ambulatory Visit: Payer: Self-pay | Admitting: Family Medicine

## 2018-07-29 DIAGNOSIS — R928 Other abnormal and inconclusive findings on diagnostic imaging of breast: Secondary | ICD-10-CM

## 2018-07-31 ENCOUNTER — Ambulatory Visit
Admission: RE | Admit: 2018-07-31 | Discharge: 2018-07-31 | Disposition: A | Discharge status: Home / Self Care | Payer: Medicare Other | Source: Ambulatory Visit | Attending: Family Medicine | Admitting: Family Medicine

## 2018-07-31 ENCOUNTER — Ambulatory Visit: Payer: Medicare Other

## 2018-07-31 DIAGNOSIS — R928 Other abnormal and inconclusive findings on diagnostic imaging of breast: Secondary | ICD-10-CM

## 2018-09-27 ENCOUNTER — Encounter: Payer: Self-pay | Admitting: Internal Medicine

## 2018-09-30 ENCOUNTER — Other Ambulatory Visit: Payer: Self-pay | Admitting: Internal Medicine

## 2018-10-15 ENCOUNTER — Ambulatory Visit (AMBULATORY_SURGERY_CENTER): Payer: Self-pay | Admitting: *Deleted

## 2018-10-15 VITALS — Ht 65.5 in | Wt 222.0 lb

## 2018-10-15 DIAGNOSIS — Z86010 Personal history of colonic polyps: Secondary | ICD-10-CM

## 2018-10-15 MED ORDER — NA SULFATE-K SULFATE-MG SULF 17.5-3.13-1.6 GM/177ML PO SOLN: ORAL | 0 refills | Status: DC

## 2018-10-15 NOTE — Progress Notes (Signed)
Patient denies any allergies to eggs or soy. Patient denies any problems with anesthesia/sedation. Patient denies any oxygen use at home. Patient denies taking any diet/weight loss medications or blood thinners. EMMI education assisgned to patient on colonoscopy, this was explained and instructions given to patient. Patient was instructed to stop Lomotil 2 days before colonoscopy.

## 2018-10-21 ENCOUNTER — Encounter: Payer: Self-pay | Admitting: Internal Medicine

## 2018-10-29 ENCOUNTER — Encounter: Payer: Medicare Other | Admitting: Internal Medicine

## 2018-11-04 ENCOUNTER — Other Ambulatory Visit: Payer: Self-pay

## 2018-11-04 ENCOUNTER — Encounter: Payer: Self-pay | Admitting: Internal Medicine

## 2018-11-04 ENCOUNTER — Ambulatory Visit (AMBULATORY_SURGERY_CENTER): Payer: Medicare Other | Admitting: Internal Medicine

## 2018-11-04 VITALS — BP 150/95 | HR 72 | Temp 99.1°F | Resp 12

## 2018-11-04 DIAGNOSIS — D127 Benign neoplasm of rectosigmoid junction: Secondary | ICD-10-CM

## 2018-11-04 DIAGNOSIS — D125 Benign neoplasm of sigmoid colon: Secondary | ICD-10-CM | POA: Diagnosis not present

## 2018-11-04 DIAGNOSIS — D123 Benign neoplasm of transverse colon: Secondary | ICD-10-CM | POA: Diagnosis not present

## 2018-11-04 DIAGNOSIS — K635 Polyp of colon: Secondary | ICD-10-CM

## 2018-11-04 DIAGNOSIS — D122 Benign neoplasm of ascending colon: Secondary | ICD-10-CM

## 2018-11-04 DIAGNOSIS — D124 Benign neoplasm of descending colon: Secondary | ICD-10-CM

## 2018-11-04 DIAGNOSIS — D129 Benign neoplasm of anus and anal canal: Secondary | ICD-10-CM

## 2018-11-04 DIAGNOSIS — Z86010 Personal history of colonic polyps: Secondary | ICD-10-CM

## 2018-11-04 DIAGNOSIS — D128 Benign neoplasm of rectum: Secondary | ICD-10-CM

## 2018-11-04 MED ORDER — SODIUM CHLORIDE 0.9 % IV SOLN: 500.0000 mL | Freq: Once | INTRAVENOUS | Status: AC

## 2018-11-04 NOTE — Progress Notes (Signed)
Pt's states no medical or surgical changes since previsit or office visit. 

## 2018-11-04 NOTE — Progress Notes (Signed)
Called to room to assist during endoscopic procedure.  Patient ID and intended procedure confirmed with present staff. Received instructions for my participation in the procedure from the performing physician.  

## 2018-11-04 NOTE — Patient Instructions (Signed)
Impression/Recommendations:  Polyp handout given to patient.  Resume previous diet. Continue present medications.  Await pathology results.  Repeat colonoscopy recommended for surveillance.  Date to be determined after pathology results reviewed.  YOU HAD AN ENDOSCOPIC PROCEDURE TODAY AT [facility]:   Refer to the procedure report that was given to you for any specific questions about what was found during the examination.  If the procedure report does not answer your questions, please call your gastroenterologist to clarify.  If you requested that your care partner not be given the details of your procedure findings, then the procedure report has been included in a sealed envelope for you to review at your convenience later.  YOU SHOULD EXPECT: Some feelings of bloating in the abdomen. Passage of more gas than usual.  Walking can help get rid of the air that was put into your GI tract during the procedure and reduce the bloating. If you had a lower endoscopy (such as a colonoscopy or flexible sigmoidoscopy) you may notice spotting of blood in your stool or on the toilet paper. If you underwent a bowel prep for your procedure, you may not have a normal bowel movement for a few days.  Please Note:  You might notice some irritation and congestion in your nose or some drainage.  This is from the oxygen used during your procedure.  There is no need for concern and it should clear up in a day or so.  SYMPTOMS TO REPORT IMMEDIATELY:   Following lower endoscopy (colonoscopy or flexible sigmoidoscopy):  Excessive amounts of blood in the stool  Significant tenderness or worsening of abdominal pains  Swelling of the abdomen that is new, acute  Fever of 100F or higher For urgent or emergent issues, a gastroenterologist can be reached at any hour by calling [phone].   DIET:  We do recommend a small meal at first, but then you may proceed to your regular diet.  Drink plenty  of fluids but you should avoid alcoholic beverages for 24 hours.  ACTIVITY:  You should plan to take it easy for the rest of today and you should NOT DRIVE or use heavy machinery until tomorrow (because of the sedation medicines used during the test).    FOLLOW UP: Our staff will call the number listed on your records the next business day following your procedure to check on you and address any questions or concerns that you may have regarding the information given to you following your procedure. If we do not reach you, we will leave a message.  However, if you are feeling well and you are not experiencing any problems, there is no need to return our call.  We will assume that you have returned to your regular daily activities without incident.  If any biopsies were taken you will be contacted by phone or by letter within the next 1-3 weeks.  Please call us at [phone] if you have not heard about the biopsies in 3 weeks.    SIGNATURES/CONFIDENTIALITY: You and/or your care partner have signed paperwork which will be entered into your electronic medical record.  These signatures attest to the fact that that the information above on your After Visit Summary has been reviewed and is understood.  Full responsibility of the confidentiality of this discharge information lies with you and/or your care-partner.

## 2018-11-04 NOTE — Op Note (Signed)
[facility] Patient Name: [name] Procedure Date: 11/04/2018 9:42 AM MRN: [id number] Endoscopist: Jerene Bears , MD Age: 58 Referring MD:  Date of Birth: 11-15-1960 Gender: Female Account #: [id number] Procedure:                Colonoscopy Indications:              Surveillance: Personal history of adenomatous                            polyps on last colonoscopy 3 years ago Medicines:                Monitored Anesthesia Care Procedure:                Pre-Anesthesia Assessment:                           - Prior to the procedure, a History and Physical                            was performed, and patient medications and                            allergies were reviewed. The patient's tolerance of                            previous anesthesia was also reviewed. The risks                            and benefits of the procedure and the sedation                            options and risks were discussed with the patient.                            All questions were answered, and informed consent                            was obtained. Prior Anticoagulants: The patient has                            taken no previous anticoagulant or antiplatelet                            agents. ASA Grade Assessment: II - A patient with                            mild systemic disease. After reviewing the risks                            and benefits, the patient was deemed in                            satisfactory condition to undergo the procedure.  After obtaining informed consent, the colonoscope                            was passed under direct vision. Throughout the                            procedure, the patient's blood pressure, pulse, and                            oxygen saturations were monitored continuously. The                            Colonoscope was introduced through the anus and                            advanced to the cecum,  identified by appendiceal                            orifice and ileocecal valve. The colonoscopy was                            performed without difficulty. The patient tolerated                            the procedure well. The quality of the bowel                            preparation was good. The ileocecal valve,                            appendiceal orifice, and rectum were photographed. Scope In: 9:52:48 AM Scope Out: 10:11:25 AM Scope Withdrawal Time: 0 hours 14 minutes 23 seconds  Total Procedure Duration: 0 hours 18 minutes 37 seconds  Findings:                 The digital rectal exam was normal.                           Two sessile polyps were found in the ascending                            colon. The polyps were 3 to 4 mm in size. These                            polyps were removed with a cold snare. Resection                            and retrieval were complete.                           Two sessile polyps were found in the transverse                            colon. The polyps were 4 to  5 mm in size. These                            polyps were removed with a cold snare. Resection                            and retrieval were complete.                           Two sessile polyps were found in the descending                            colon. The polyps were 4 to 5 mm in size. These                            polyps were removed with a cold snare. Resection                            and retrieval were complete.                           A 5 mm polyp was found in the sigmoid colon. The                            polyp was sessile. The polyp was removed with a                            cold snare. Resection and retrieval were complete.                           A 3 mm polyp was found in the rectum. The polyp was                            sessile. The polyp was removed with a cold snare.                            Resection and retrieval were complete.                            The retroflexed view of the distal rectum and anal                            verge was normal and showed no anal or rectal                            abnormalities. Complications:            No immediate complications. Estimated Blood Loss:     Estimated blood loss was minimal. Impression:               - Two 3 to 4 mm polyps in the ascending colon,  removed with a cold snare. Resected and retrieved.                           - Two 4 to 5 mm polyps in the transverse colon,                            removed with a cold snare. Resected and retrieved.                           - Two 4 to 5 mm polyps in the descending colon,                            removed with a cold snare. Resected and retrieved.                           - One 5 mm polyp in the sigmoid colon, removed with                            a cold snare. Resected and retrieved.                           - One 3 mm polyp in the rectum, removed with a cold                            snare. Resected and retrieved.                           - The distal rectum and anal verge are normal on                            retroflexion view. Recommendation:           - Patient has a contact number available for                            emergencies. The signs and symptoms of potential                            delayed complications were discussed with the                            patient. Return to normal activities tomorrow.                            Written discharge instructions were provided to the                            patient.                           - Resume previous diet.                           - Continue present medications.                           -  Await pathology results.                           - Repeat colonoscopy is recommended for                            surveillance. The colonoscopy date will be                            determined after pathology results from  today's                            exam become available for review. Jerene Bears, MD 11/04/2018 10:24:14 AM This report has been signed electronically.

## 2018-11-04 NOTE — Progress Notes (Signed)
Report given to PACU, vss 

## 2018-11-05 ENCOUNTER — Telehealth: Payer: Self-pay | Admitting: *Deleted

## 2018-11-05 NOTE — Telephone Encounter (Signed)
  Follow up Call-  Call back number 11/04/2018  Post procedure Call Back phone  # [phone]  Permission to leave phone message Yes  Some recent data might be hidden     Patient questions:  Do you have a fever, pain , or abdominal swelling? No. Pain Score  0 *  Have you tolerated food without any problems? Yes.    Have you been able to return to your normal activities? Yes.    Do you have any questions about your discharge instructions: Diet   No. Medications  No. Follow up visit  No.  Do you have questions or concerns about your Care? No.  Actions: * If pain score is 4 or above: No action needed, pain <4.

## 2018-11-11 ENCOUNTER — Encounter: Payer: Self-pay | Admitting: Internal Medicine

## 2018-11-19 ENCOUNTER — Other Ambulatory Visit: Payer: Self-pay | Admitting: Internal Medicine

## 2018-11-19 DIAGNOSIS — Z86010 Personal history of colonic polyps: Secondary | ICD-10-CM

## 2019-02-11 ENCOUNTER — Other Ambulatory Visit: Payer: Self-pay | Admitting: *Deleted

## 2019-02-11 MED ORDER — DIPHENOXYLATE-ATROPINE 2.5-0.025 MG PO TABS: ORAL_TABLET | ORAL | 2 refills | Status: DC

## 2019-04-24 ENCOUNTER — Other Ambulatory Visit: Payer: Self-pay | Admitting: Physician Assistant

## 2019-04-24 DIAGNOSIS — Z1231 Encounter for screening mammogram for malignant neoplasm of breast: Secondary | ICD-10-CM

## 2019-06-04 ENCOUNTER — Other Ambulatory Visit: Payer: Self-pay | Admitting: Internal Medicine

## 2019-06-06 ENCOUNTER — Other Ambulatory Visit: Payer: Self-pay

## 2019-06-06 ENCOUNTER — Other Ambulatory Visit: Payer: Self-pay | Admitting: Physician Assistant

## 2019-06-06 ENCOUNTER — Ambulatory Visit
Admission: RE | Admit: 2019-06-06 | Discharge: 2019-06-06 | Disposition: A | Discharge status: Home / Self Care | Payer: Medicare Other | Source: Ambulatory Visit | Attending: Physician Assistant | Admitting: Physician Assistant

## 2019-06-06 DIAGNOSIS — Z1231 Encounter for screening mammogram for malignant neoplasm of breast: Secondary | ICD-10-CM

## 2019-06-06 DIAGNOSIS — N644 Mastodynia: Secondary | ICD-10-CM

## 2019-06-19 ENCOUNTER — Other Ambulatory Visit: Payer: Medicare Other

## 2019-06-27 ENCOUNTER — Ambulatory Visit
Admission: RE | Admit: 2019-06-27 | Discharge: 2019-06-27 | Disposition: A | Discharge status: Home / Self Care | Payer: Medicare Other | Source: Ambulatory Visit | Attending: Physician Assistant | Admitting: Physician Assistant

## 2019-06-27 ENCOUNTER — Other Ambulatory Visit: Payer: Self-pay

## 2019-06-27 DIAGNOSIS — N644 Mastodynia: Secondary | ICD-10-CM

## 2019-08-25 ENCOUNTER — Other Ambulatory Visit: Payer: Self-pay | Admitting: Internal Medicine

## 2019-08-25 DIAGNOSIS — Z86010 Personal history of colonic polyps: Secondary | ICD-10-CM

## 2019-09-09 ENCOUNTER — Encounter: Payer: Self-pay | Admitting: *Deleted

## 2019-09-10 ENCOUNTER — Institutional Professional Consult (permissible substitution): Payer: Self-pay | Admitting: Diagnostic Neuroimaging

## 2019-09-11 ENCOUNTER — Encounter: Payer: Self-pay | Admitting: Diagnostic Neuroimaging

## 2019-09-23 ENCOUNTER — Ambulatory Visit: Payer: Medicare Other | Admitting: Diagnostic Neuroimaging

## 2019-09-23 ENCOUNTER — Other Ambulatory Visit: Payer: Self-pay

## 2019-09-23 ENCOUNTER — Encounter: Payer: Self-pay | Admitting: Diagnostic Neuroimaging

## 2019-09-23 VITALS — BP 146/91 | HR 125 | Temp 97.5°F | Ht 65.0 in | Wt 246.0 lb

## 2019-09-23 DIAGNOSIS — R251 Tremor, unspecified: Secondary | ICD-10-CM

## 2019-09-23 DIAGNOSIS — F419 Anxiety disorder, unspecified: Secondary | ICD-10-CM | POA: Diagnosis not present

## 2019-09-23 DIAGNOSIS — R413 Other amnesia: Secondary | ICD-10-CM | POA: Diagnosis not present

## 2019-09-23 NOTE — Progress Notes (Signed)
[facility]  PATIENT: [name] DOB: 10/13/1960  REFERRING CLINICIAN:  HISTORY FROM: patient  REASON FOR VISIT: follow up    HISTORICAL  CHIEF COMPLAINT:  Chief Complaint  Patient presents with  . Memory Loss    rm 6, New Pt  MMSE 26  . Tremors    HISTORY OF PRESENT ILLNESS:   UPDATE (09/23/19, VRP): Since last visit, doing poorly. More stress, anxiety, depression, memory loss, tremors. Working with PCP on med mgmt. HA are improved (only few times per year; tylenol and sumatriptan helping).  UPDATE 10/06/13: Since last visit, headaches have worsened, now daily. She has exacerbation every other day. She is a sumatriptan and Tylenol as needed for headaches. Also still struggling with irritable bowel syndrome, fibromyalgia, chronic pain, depression, anxiety, insomnia. Still struggling with unresolved psychological stress.  PRIOR HPI (01/29/13): 60 year old right-handed female here for evaluation of headaches, vision changes, muscle cramps, pain.  Patient reports long history of migraine headaches, 20 days of headache per month. These are bilateral severe headaches with nausea, vomiting, photophobia and phonophobia. Headaches can last up to one to 2 days at a time. She thinks she may have tried amitriptyline and Topamax the past.  Also patient is concerned about possibility of multiple sclerosis. Patient's sister diagnosed with multiple scars 2004 and the patient feels she is having similar symptoms. She describes intermittent cramps of her right hand lasting for a few seconds a time. She has intermittent left facial pain. She has had intermittent postural tremor for the past 3-4 months. Tremor can also occur at rest. She has significant gait deterioration over the past one to 2 years. She is having some urinary and bowel control problems. Also reports numerous things and review of systems.  REVIEW OF SYSTEMS: Full 14 system review of systems performed and  negative except: as per HPI.  ALLERGIES: Allergies  Allergen Reactions  . Aspirin Swelling  . Lyrica [Pregabalin] Other (See Comments)    Wt gain, fluid retention  . Benzocaine Rash    HOME MEDICATIONS: Outpatient Medications Prior to Visit  Medication Sig Dispense Refill  . acetaminophen (TYLENOL) 500 MG tablet Take 500 mg by mouth every 6 (six) hours as needed for pain.    Marland Kitchen amoxicillin (AMOXIL) 500 MG tablet Take 500 mg by mouth 3 (three) times daily.    . busPIRone (BUSPAR) 10 MG tablet Take 10 mg by mouth 2 (two) times daily.    . cetirizine (ZYRTEC) 10 MG tablet Take by mouth.    . clonazePAM (KLONOPIN) 0.5 MG tablet Take by mouth 3 (three) times daily.    . diphenoxylate-atropine (LOMOTIL) 2.5-0.025 MG tablet TAKE 2 TABLETS BY MOUTH 4  TIMES DAILY 240 tablet 1  . fluticasone furoate-vilanterol (BREO ELLIPTA) 100-25 MCG/INH AEPB Inhale 1 puff daily into the lungs. 1 each 0  . hydrOXYzine (VISTARIL) 25 MG capsule Take 1 capsule by mouth daily.    Marland Kitchen ipratropium (ATROVENT HFA) 17 MCG/ACT inhaler Inhale 2 puffs into the lungs every 6 (six) hours.    . montelukast (SINGULAIR) 10 MG tablet Take 10 mg by mouth daily.    . ondansetron (ZOFRAN) 4 MG tablet Take 4 mg by mouth as needed.    . pantoprazole (PROTONIX) 40 MG tablet Take 1 tablet (40 mg total) by mouth 2 (two) times daily. 180 tablet 6  . rosuvastatin (CRESTOR) 10 MG tablet Take 1 tablet by mouth daily.    . SUMAtriptan (IMITREX) 25 MG tablet Take 25 mg  by mouth every 2 (two) hours as needed for migraine or headache. May repeat in 2 hours if headache persists or recurs.    . venlafaxine XR (EFFEXOR-XR) 75 MG 24 hr capsule Take 1 capsule by mouth daily.    . Vitamin D, Ergocalciferol, (DRISDOL) 1.25 MG (50000 UNIT) CAPS capsule once a week.    . zolpidem (AMBIEN) 5 MG tablet 1-2 tbalets QHS prn sleep 30 tablet 0   Facility-Administered Medications Prior to Visit  Medication Dose Route Frequency Provider Last Rate Last Admin   . 0.9 %  sodium chloride infusion  500 mL Intravenous Once Pyrtle, Lajuan Lines, MD        PAST MEDICAL HISTORY: Past Medical History:  Diagnosis Date  . Adenomatous colon polyp   . Anxiety and depression   . Asthma   . CKD (chronic kidney disease)   . Family history of anesthesia complication    mom was slow in awakening  . Fibromyalgia   . GERD (gastroesophageal reflux disease)   . Hyperlipidemia   . IBS (irritable bowel syndrome)   . Insomnia   . Migraine   . Palpitations    assoc w anxiety  . Shortness of breath    w/ activities and sometimes when lying flat  . Sleep apnea    uses cpap  . Tubular adenoma of colon   . Vitamin B12 deficiency     PAST SURGICAL HISTORY: Past Surgical History:  Procedure Laterality Date  . CHOLECYSTECTOMY  1989  . COLONOSCOPY  11/01/2015  . ECTOPIC PREGNANCY SURGERY    . ELBOW SURGERY    . ESOPHAGOGASTRODUODENOSCOPY N/A 07/10/2013   Procedure: ESOPHAGOGASTRODUODENOSCOPY (EGD);  Surgeon: Gatha Mayer, MD;  Location: [facility];  Service: Endoscopy;  Laterality: N/A;  . HERNIA REPAIR    . KNEE ARTHROSCOPY WITH LATERAL MENISECTOMY Right 02/20/2013   Procedure: RIGHT KNEE ARTHROSCOPY WITH MEDIAL AND LATERAL MENISECTOMY, Abrasion Chondroplasty of medial and lateral femoral condyle, microfracture of medial and lateral condyle, synovectomy of supra patella pouch;  Surgeon: Tobi Bastos, MD;  Location: [facility];  Service: Orthopedics;  Laterality: Right;  . KNEE SURGERY    . POLYPECTOMY    . VAGINAL HYSTERECTOMY      FAMILY HISTORY: Family History  Problem Relation Age of Onset  . Diabetes Mother   . Depression Mother   . Hypertension Mother   . Heart attack Father   . Migraines Father   . Multiple sclerosis Sister   . Kidney failure Sister   . Depression Other   . Diabetes Other   . Heart attack Other   . Breast cancer Paternal Grandmother        unsure of age  . Breast cancer Maternal Grandmother        unsure  of age  . Colon cancer Neg Hx   . Esophageal cancer Neg Hx   . Stomach cancer Neg Hx   . Colon polyps Neg Hx   . Rectal cancer Neg Hx     SOCIAL HISTORY:  Social History   Socioeconomic History  . Marital status: Divorced    Spouse name: Not on file  . Number of children: 1  . Years of education: 12th  . Highest education level: Not on file  Occupational History  . Occupation: N/A  Tobacco Use  . Smoking status: Never Smoker  . Smokeless tobacco: Never Used  Substance and Sexual Activity  . Alcohol use: Not Currently    Alcohol/week: 2.0 standard  drinks    Types: 2 Standard drinks or equivalent per week  . Drug use: No  . Sexual activity: Not on file  Other Topics Concern  . Not on file  Social History Narrative   Pt lives at home alone.   Caffeine Use: none; quit years ago   Social Determinants of Health   Financial Resource Strain:   . Difficulty of Paying Living Expenses: Not on file  Food Insecurity:   . Worried About Charity fundraiser in the Last Year: Not on file  . Ran Out of Food in the Last Year: Not on file  Transportation Needs:   . Lack of Transportation (Medical): Not on file  . Lack of Transportation (Non-Medical): Not on file  Physical Activity:   . Days of Exercise per Week: Not on file  . Minutes of Exercise per Session: Not on file  Stress:   . Feeling of Stress : Not on file  Social Connections:   . Frequency of Communication with Friends and Family: Not on file  . Frequency of Social Gatherings with Friends and Family: Not on file  . Attends Religious Services: Not on file  . Active Member of Clubs or Organizations: Not on file  . Attends Archivist Meetings: Not on file  . Marital Status: Not on file  Intimate Partner Violence:   . Fear of Current or Ex-Partner: Not on file  . Emotionally Abused: Not on file  . Physically Abused: Not on file  . Sexually Abused: Not on file     PHYSICAL EXAM  GENERAL  EXAM/CONSTITUTIONAL: Vitals:  Vitals:   09/23/19 1034  BP: (!) 146/91  Pulse: (!) 125  Temp: (!) 97.5 F (36.4 C)  Weight: 246 lb (111.6 kg)  Height: 5\' 5"  (1.651 m)     Body mass index is 40.94 kg/m. Wt Readings from Last 3 Encounters:  09/23/19 246 lb (111.6 kg)  10/15/18 222 lb (100.7 kg)  07/09/17 225 lb 9.6 oz (102.3 kg)     Patient is in no distress; well developed, nourished and groomed; neck is supple  CARDIOVASCULAR:  Examination of carotid arteries is normal; no carotid bruits  Regular rate and rhythm, no murmurs  Examination of peripheral vascular system by observation and palpation is normal  EYES:  Ophthalmoscopic exam of optic discs and posterior segments is normal; no papilledema or hemorrhages  No exam data present  MUSCULOSKELETAL:  Gait, strength, tone, movements noted in Neurologic exam below  NEUROLOGIC: MENTAL STATUS:  MMSE - Mini Mental State Exam 09/23/2019  Orientation to time 4  Orientation to Place 5  Registration 3  Attention/ Calculation 2  Recall 3  Language- name 2 objects 2  Language- repeat 1  Language- follow 3 step command 3  Language- read & follow direction 1  Write a sentence 1  Copy design 1  Copy design-comments trembling of both hands  Total score 26    awake, alert, oriented to person, place and time  recent and remote memory intact  normal attention and concentration  language fluent, comprehension intact, naming intact  fund of knowledge appropriate  ANXIOUS APPEARING  CRANIAL NERVE:   2nd - no papilledema on fundoscopic exam  2nd, 3rd, 4th, 6th - pupils equal and reactive to light, visual fields full to confrontation, extraocular muscles intact, no nystagmus  5th - facial sensation symmetric  7th - facial strength symmetric  8th - hearing intact  9th - palate elevates symmetrically, uvula midline  11th - shoulder shrug symmetric  12th - tongue protrusion midline  MOTOR:   normal bulk  and tone, full strength in the BUE, BLE  POSTURAL AND RESTING TREMOR IN BUE AND BLE  AKATHISIA; RIGHT LEG BOUNCING  SENSORY:   normal and symmetric to light touch  COORDINATION:   finger-nose-finger, fine finger movements normal  REFLEXES:   deep tendon reflexes present and symmetric  GAIT/STATION:   narrow based gait; USING 4 POINT CANE      DIAGNOSTIC DATA (LABS, IMAGING, TESTING) - I reviewed patient records, labs, notes, testing and imaging myself where available.  Lab Results  Component Value Date   WBC 3.7 (L) 07/10/2013      Component Value Date/Time   NA 139 07/21/2013 1441   No results found for: CHOL Lab Results  Component Value Date   HGBA1C 4.9 07/09/2013   No results found for: [id number] Lab Results  Component Value Date   TSH 4.26 06/12/2013   02/09/13 MRI BRAIN - normal  02/09/13 MRI CERVICAL - mild disc bulging from C3-4 down to T2-3. No spinal stenosis or foraminal narrowing. No intrinsic or compressive spinal cord lesions.   ASSESSMENT AND PLAN  59 y.o. year old female here with migraine headaches and tremor.   Dx:  1. Memory loss   2. Tremor   3. Anxiety      MEMORY LOSS / TREMORS (due to uncontrolled anxiety + depression) - continue medical mgmt of mood disorders; consider psychiatry / psychology treatments (patient is more open to this now; will ask PCP for referral)  Return for return to PCP, pending if symptoms worsen or fail to improve.   I spent 15 minutes of face-to-face and non-face-to-face time with patient.  This included previsit chart review, lab review, study review, order entry, electronic health record documentation, patient education.       Penni Bombard, MD [id number], [id number] AM Certified in Neurology, Neurophysiology and Neuroimaging  [facility] [address], [place], [place] [address] [phone]

## 2019-10-10 ENCOUNTER — Ambulatory Visit: Payer: Medicare Other | Admitting: Cardiology

## 2019-10-20 ENCOUNTER — Ambulatory Visit: Payer: Medicare Other | Admitting: Cardiology

## 2020-03-02 ENCOUNTER — Other Ambulatory Visit: Payer: Self-pay | Admitting: Physician Assistant

## 2020-03-02 DIAGNOSIS — Z1231 Encounter for screening mammogram for malignant neoplasm of breast: Secondary | ICD-10-CM

## 2020-06-28 ENCOUNTER — Ambulatory Visit: Payer: Medicare Other

## 2020-08-03 ENCOUNTER — Ambulatory Visit: Payer: Medicare Other

## 2020-11-09 ENCOUNTER — Other Ambulatory Visit: Payer: Self-pay | Admitting: Physician Assistant

## 2020-11-09 DIAGNOSIS — Z1231 Encounter for screening mammogram for malignant neoplasm of breast: Secondary | ICD-10-CM

## 2021-06-06 ENCOUNTER — Ambulatory Visit: Payer: Medicare Other

## 2021-09-13 IMAGING — MG DIGITAL DIAGNOSTIC BILAT W/ TOMO
6 of 10 series · 6 of 30 positions shown · non-contrast
Comparison: Previous exam(s).

CLINICAL DATA: 58-year-old patient with palpable area of
concern/pain in the outer right breast.

EXAM:
DIGITAL DIAGNOSTIC BILATERAL MAMMOGRAM WITH CAD AND TOMO
ULTRASOUND RIGHT BREAST

[R TAN synth-2D]
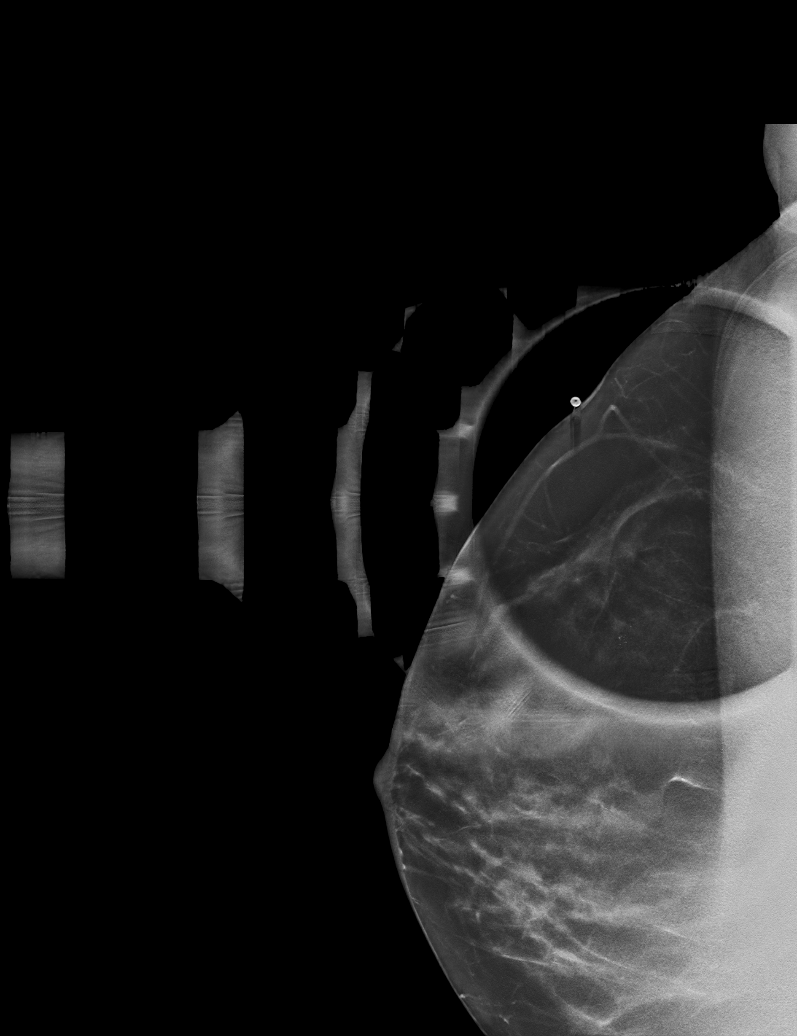

[R MLO synth-2D]
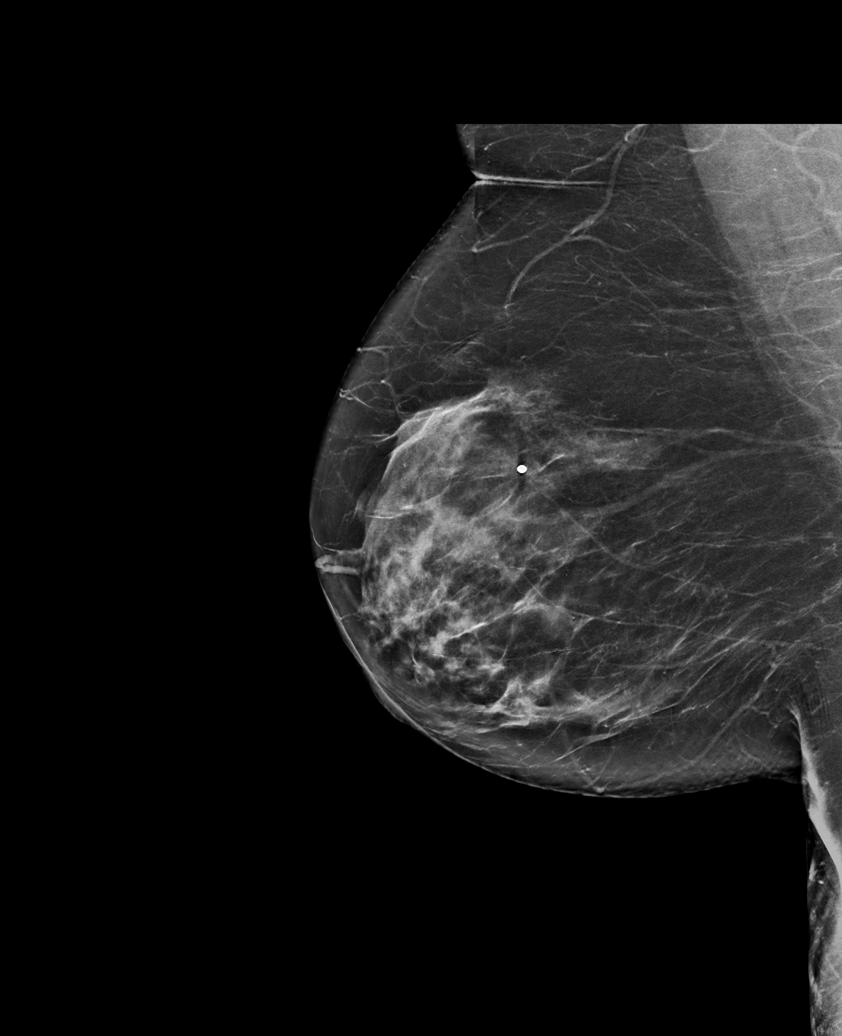

[L CC synth-2D]
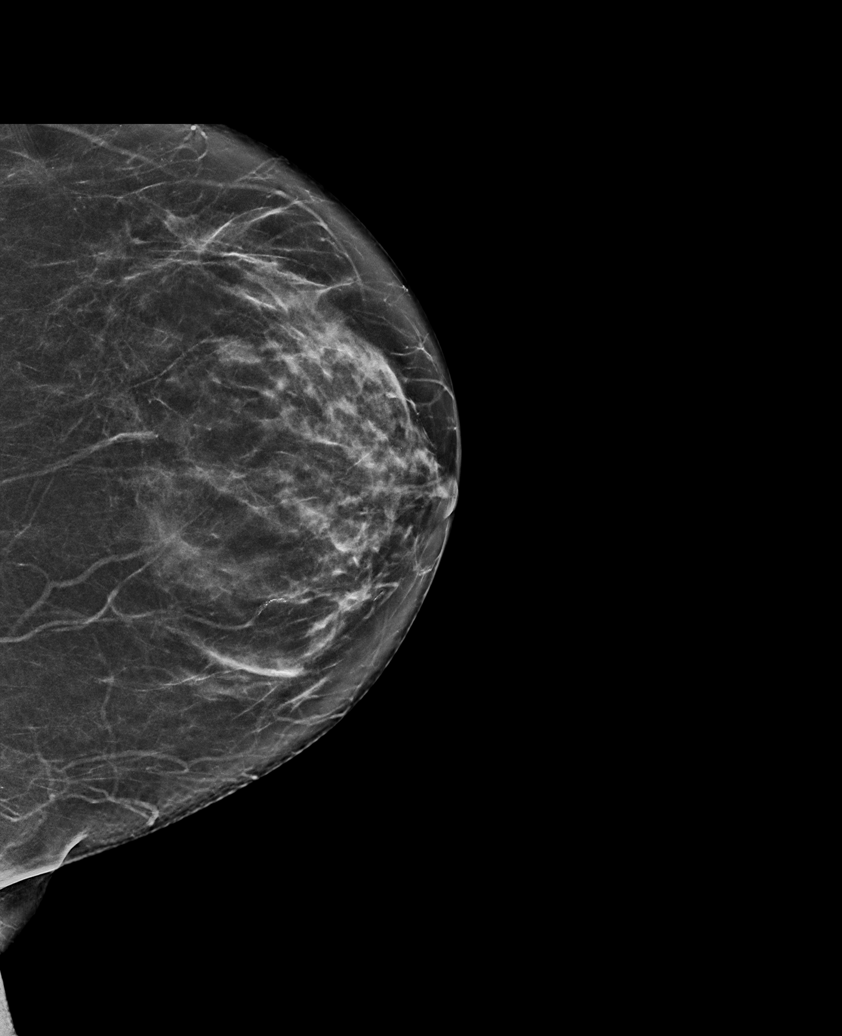

[R CC synth-2D]
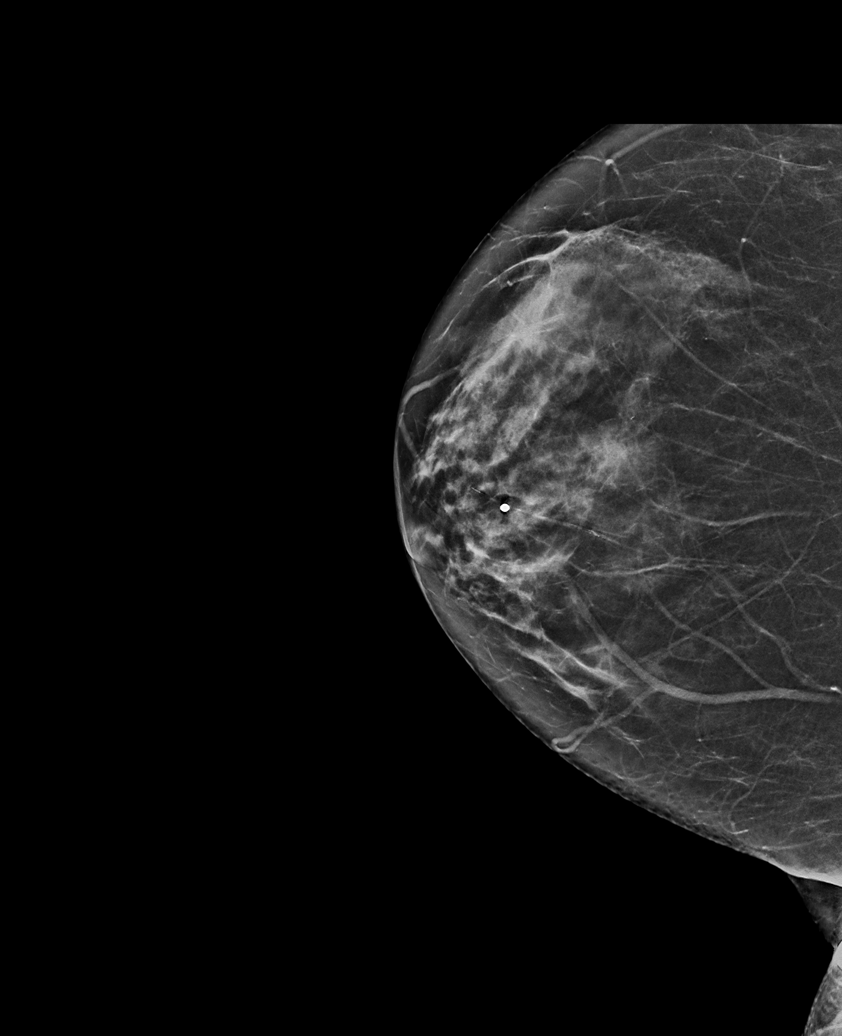

[L MLO synth-2D]
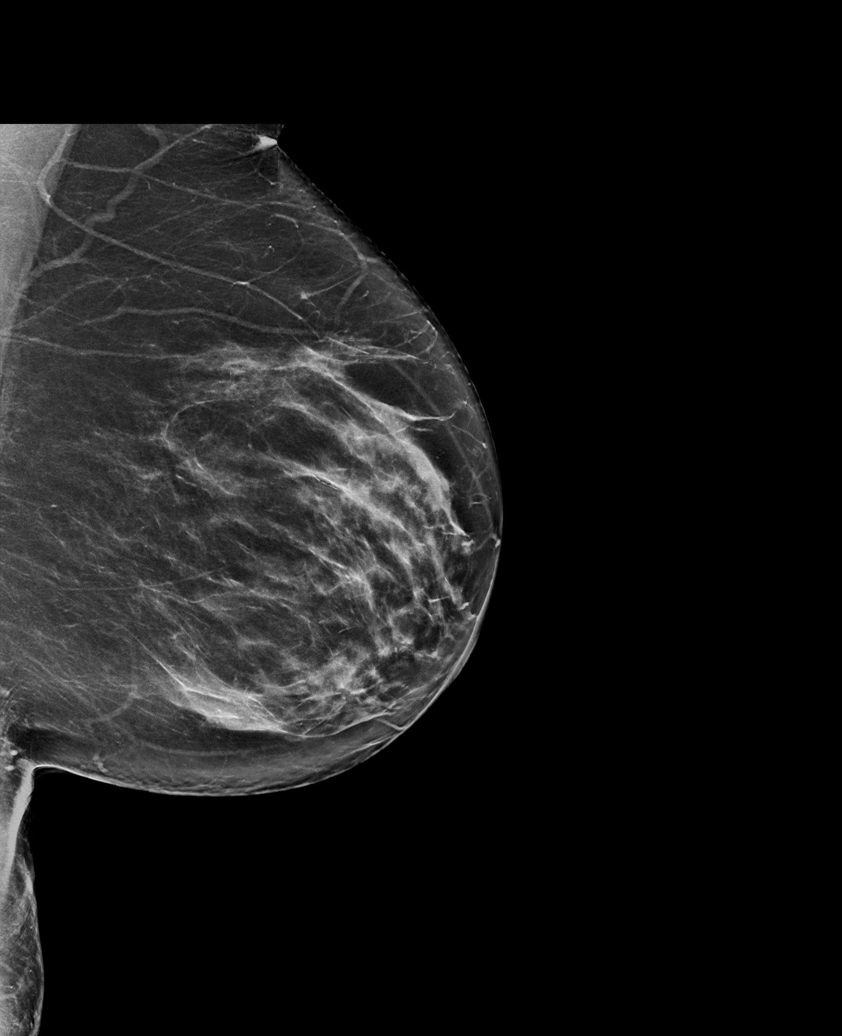

[L CC tomo · tomo slice 37/72.0]
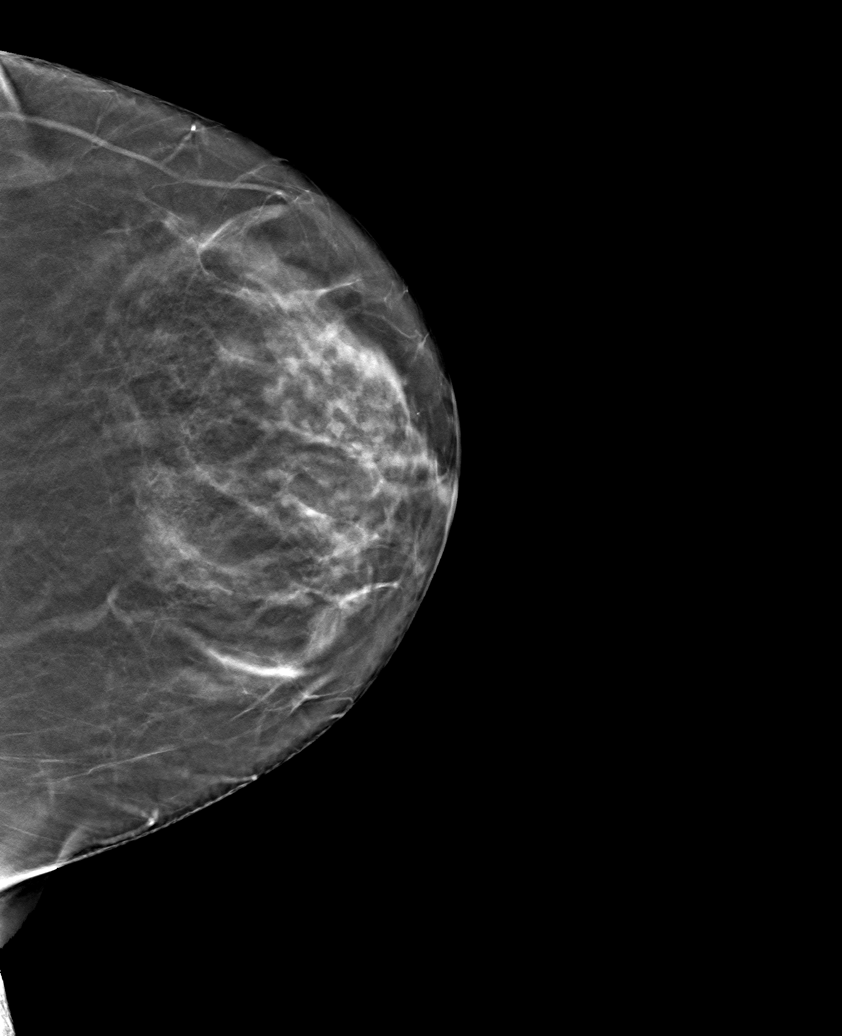

[6 of 30 positions shown; findings below may reference images not displayed]

ACR Breast Density Category c: The breast tissue is heterogeneously
dense, which may obscure small masses.
FINDINGS: No mass, architectural distortion, or suspicious microcalcification
is identified to suggest malignancy in either breast. Spot
tangential view of the region of patient concern in the outer right
breast is negative.

Mammographic images were processed with CAD.

On physical exam, I palpate soft nodularity in the outer right
breast. I do not palpate a discrete suspicious mass.

Targeted ultrasound is performed, showing fatty and glandular tissue
in the outer right breast in the 8 o'clock region of patient
concern. No solid or cystic mass or abnormal shadowing is
identified.
IMPRESSION: No evidence of malignancy in either breast.

RECOMMENDATION:
Screening mammogram in one year.(Code:W3-2-0G1)

I have discussed the findings and recommendations with the patient.
If applicable, a reminder letter will be sent to the patient
regarding the next appointment.

BI-RADS CATEGORY  1: Negative.

## 2022-06-22 ENCOUNTER — Encounter: Payer: Self-pay | Admitting: Internal Medicine
